# Patient Record
Sex: Female | Born: 1960 | Race: White | Hispanic: No | Marital: Married | Smoking: Never smoker
Health system: Southern US, Community
[De-identification: ages and names within clinical notes are randomized; demographics above are authoritative.]

## PROBLEM LIST (undated history)

## (undated) DIAGNOSIS — K648 Other hemorrhoids: Secondary | ICD-10-CM

## (undated) DIAGNOSIS — B379 Candidiasis, unspecified: Secondary | ICD-10-CM

## (undated) DIAGNOSIS — F53 Postpartum depression: Secondary | ICD-10-CM

## (undated) DIAGNOSIS — J302 Other seasonal allergic rhinitis: Secondary | ICD-10-CM

## (undated) DIAGNOSIS — M545 Low back pain, unspecified: Secondary | ICD-10-CM

## (undated) DIAGNOSIS — Z780 Asymptomatic menopausal state: Secondary | ICD-10-CM

## (undated) DIAGNOSIS — C439 Malignant melanoma of skin, unspecified: Secondary | ICD-10-CM

## (undated) DIAGNOSIS — O99345 Other mental disorders complicating the puerperium: Secondary | ICD-10-CM

## (undated) HISTORY — DX: Candidiasis, unspecified: B37.9

## (undated) HISTORY — PX: NOSE SURGERY: SHX723

## (undated) HISTORY — DX: Asymptomatic menopausal state: Z78.0

## (undated) HISTORY — DX: Other seasonal allergic rhinitis: J30.2

## (undated) HISTORY — DX: Malignant melanoma of skin, unspecified: C43.9

## (undated) HISTORY — DX: Low back pain: M54.5

## (undated) HISTORY — PX: WISDOM TOOTH EXTRACTION: SHX21

## (undated) HISTORY — DX: Low back pain, unspecified: M54.50

## (undated) HISTORY — PX: MELANOMA EXCISION: SHX5266

## (undated) HISTORY — DX: Postpartum depression: F53.0

## (undated) HISTORY — DX: Other mental disorders complicating the puerperium: O99.345

## (undated) HISTORY — DX: Other hemorrhoids: K64.8

---

## 1999-11-02 ENCOUNTER — Other Ambulatory Visit: Admission: RE | Admit: 1999-11-02 | Discharge: 1999-11-02 | Payer: Self-pay | Admitting: Obstetrics and Gynecology

## 2000-08-10 ENCOUNTER — Inpatient Hospital Stay (HOSPITAL_COMMUNITY): Admission: AD | Admit: 2000-08-10 | Discharge: 2000-08-12 | Payer: Self-pay | Admitting: Obstetrics and Gynecology

## 2000-08-14 ENCOUNTER — Encounter: Admission: RE | Admit: 2000-08-14 | Discharge: 2000-09-13 | Payer: Self-pay | Admitting: Obstetrics and Gynecology

## 2000-09-22 ENCOUNTER — Other Ambulatory Visit: Admission: RE | Admit: 2000-09-22 | Discharge: 2000-09-22 | Payer: Self-pay | Admitting: Obstetrics and Gynecology

## 2003-06-28 ENCOUNTER — Other Ambulatory Visit: Admission: RE | Admit: 2003-06-28 | Discharge: 2003-06-28 | Payer: Self-pay | Admitting: Obstetrics and Gynecology

## 2004-08-27 ENCOUNTER — Encounter: Admission: RE | Admit: 2004-08-27 | Discharge: 2004-08-27 | Payer: Self-pay | Admitting: Family Medicine

## 2007-11-19 ENCOUNTER — Other Ambulatory Visit: Admission: RE | Admit: 2007-11-19 | Discharge: 2007-11-19 | Payer: Self-pay | Admitting: Family Medicine

## 2008-11-23 ENCOUNTER — Encounter: Admission: RE | Admit: 2008-11-23 | Discharge: 2008-11-23 | Payer: Self-pay | Admitting: Family Medicine

## 2010-06-24 ENCOUNTER — Encounter: Payer: Self-pay | Admitting: Gastroenterology

## 2010-10-19 NOTE — H&P (Signed)
[facility]  Patient:    Traci Welch, Traci Welch                        MRN: [id number] Adm. Date:  [id number] Attending:  Silverio Lay A                         History and Physical  REASON FOR ADMISSION:         Intrauterine pregnancy at 39 weeks and 2 days with regular uterine contractions.  HISTORY OF PRESENT ILLNESS:   This is a 50 year old married white female, gravida 4, para 2, aborta 1, with a due date of August 15, 2000, who started experiencing regular uterine contractions every five minutes or less, starting at 7:30 this morning.  She is currently experiencing contractions every 2-3 minutes of an intensity of 8-10/10, lasting 60-90 seconds.  She has had a mild bloody show, no leaking of fluid, and reports good fetal activity and denies any symptoms of pregnancy-induced hypertension.  PRENATAL COURSE:              Blood type O positive, RPR nonreactive, rubella immune, HBsAg negative, HIV negative, gonorrhea negative, Chlamydia negative, Pap smear normal, 16 week amniocentesis showing a normal female karyotype, 20 week ultrasound revealing a normal anatomy cervi with a posterior placenta and normal cervical length.  A 28 week glucose tolerance test was within normal limits, and 25 week group B streptococcus was positive.  Prenatal course was otherwise uneventful.  PAST MEDICAL HISTORY:         No known drug allergies.  In 1998, spontaneous miscarriage at seven weeks.  No D&C.  No complication.  In April 1999, elective primary low transverse cesarean section for breech presentation of a female infant born at 40 weeks, weighing 7 pounds 14 ounces.  Baby deceased at 5-1/2 months of SIDS.  August 2000, spontaneous vaginal delivery at 38 weeks of a female infant, weighing 7 pounds 4 ounces, no complications.  History of postpartum depression with first pregnancy probably due to grief.  History of abnormal Pap smears with no treatment.  FAMILY HISTORY:                Mother with chronic hypertension.  Strong family history of depression.  SOCIAL HISTORY:               Married, nonsmoker, homemaker.  PHYSICAL EXAMINATION:  VITAL SIGNS:                  Normal.  HEENT:                        Negative.  LUNGS:                        Clear.  HEART:                        Normal.  ABDOMEN:                      Gravid, nontender.  Fundal height is at 38 cm two days ago.  VAGINAL:                      Exam by admitting nurse, 5-6 cm, 90% effaced, vertex 0 +1.  Fetal heart rate tracings are reactive.  EXTREMITIES:  Negative.  ASSESSMENT:                   Intrauterine pregnancy at 39 weeks and 2 days in active labor.  Status post primary low transverse cesarean section with one previous successful VBAC.  History of postpartum depression.  PLAN:                         The patient is admitted to L&D, is requesting epidural.  Will rupture membranes as soon as possible.  Spontaneous vaginal delivery expected. DD:  08/10/00 TD:  08/10/00 Job: [id number]

## 2011-11-28 ENCOUNTER — Other Ambulatory Visit: Payer: Self-pay | Admitting: Obstetrics and Gynecology

## 2011-11-28 DIAGNOSIS — Z1231 Encounter for screening mammogram for malignant neoplasm of breast: Secondary | ICD-10-CM

## 2012-01-01 ENCOUNTER — Ambulatory Visit
Admission: RE | Admit: 2012-01-01 | Discharge: 2012-01-01 | Disposition: A | Discharge status: Home / Self Care | Payer: 59 | Source: Ambulatory Visit | Attending: Obstetrics and Gynecology | Admitting: Obstetrics and Gynecology

## 2012-01-01 DIAGNOSIS — Z1231 Encounter for screening mammogram for malignant neoplasm of breast: Secondary | ICD-10-CM

## 2012-01-27 ENCOUNTER — Telehealth: Payer: Self-pay | Admitting: Obstetrics and Gynecology

## 2012-02-19 ENCOUNTER — Encounter: Payer: Self-pay | Admitting: Obstetrics and Gynecology

## 2012-02-19 ENCOUNTER — Ambulatory Visit (INDEPENDENT_AMBULATORY_CARE_PROVIDER_SITE_OTHER): Payer: 59 | Admitting: Obstetrics and Gynecology

## 2012-02-19 VITALS — BP 104/58 | Ht 68.0 in | Wt 146.0 lb

## 2012-02-19 DIAGNOSIS — N951 Menopausal and female climacteric states: Secondary | ICD-10-CM

## 2012-02-19 DIAGNOSIS — Z01419 Encounter for gynecological examination (general) (routine) without abnormal findings: Secondary | ICD-10-CM

## 2012-02-19 DIAGNOSIS — K649 Unspecified hemorrhoids: Secondary | ICD-10-CM

## 2012-02-19 LAB — FOLLICLE STIMULATING HORMONE: FSH: 112.6 m[IU]/mL

## 2012-02-19 MED ORDER — DOXEPIN HCL 3 MG PO TABS: 3.0000 mg | ORAL_TABLET | Freq: Every day | ORAL | Status: DC

## 2012-02-19 MED ORDER — NORETHINDRONE ACET-ETHINYL EST 1-20 MG-MCG PO TABS: 1.0000 | ORAL_TABLET | Freq: Every day | ORAL | Status: DC

## 2012-02-19 MED ORDER — SERTRALINE HCL 100 MG PO TABS: 100.0000 mg | ORAL_TABLET | Freq: Every day | ORAL | Status: DC

## 2012-02-19 NOTE — Progress Notes (Signed)
The patient reports:normal menses, no abnormal bleeding, pelvic pain or discharge  Contraception:vasectomy  Last mammogram: was normal August 2013 Last pap: was normal 2007  GC/Chlamydia cultures offered: declined HIV/RPR/HbsAg offered:  declined HSV 1 and 2 glycoprotein offered: declined  Menstrual cycle regular and monthly: No: perimenopausal  Menstrual flow normal: No: heavy   Urinary symptoms: none Normal bowel movements: Yes Reports abuse at home: No:   Subjective:    Traci Welch is a 51 y.o. female, [phone], who presents for an annual exam.     History   Social History  . Marital Status: Married    Spouse Name: N/A    Number of Children: N/A  . Years of Education: N/A   Social History Main Topics  . Smoking status: Never Smoker   . Smokeless tobacco: Never Used  . Alcohol Use: 4.2 oz/week    7 Glasses of wine per week  . Drug Use: No  . Sexually Active: Yes    Birth Control/ Protection: None, Surgical     vas   Other Topics Concern  . None   Social History Narrative  . None    Menstrual cycle:   LMP: Patient's last menstrual period was 01/03/2012.           Cycle: irregular every 28-60 days, 6 days, heavy flow for 6 days, 1 pad every 2 hours, clots ad 1 cm. No dysmenorrhea. Feels constant pressure on bladder like when pregnant. No PCB or dyspareunia. Hot flashes improved with black cohosh.Night sweats  The following portions of the patient's history were reviewed and updated as appropriate: allergies, current medications, past family history, past medical history, past social history, past surgical history and problem list.  Review of Systems Pertinent items are noted in HPI. Breast:Negative for breast lump,nipple discharge or nipple retraction Gastrointestinal: Negative for abdominal pain, change in bowel habits or rectal bleeding. Chronic constipation with severe hemorrhoids. Urinary:negative   Objective:    BP 104/58  Ht 5\' 8"  (1.727 m)  Wt 146  lb (66.225 kg)  BMI 22.20 kg/m2  LMP 01/03/2012    Weight:  Wt Readings from Last 1 Encounters:  02/19/12 146 lb (66.225 kg)          BMI: Body mass index is 22.20 kg/(m^2).  General Appearance: Alert, appropriate appearance for age. No acute distress HEENT: Grossly normal Neck / Thyroid: Supple, no masses, nodes or enlargement Lungs: clear to auscultation bilaterally Back: No CVA tenderness Breast Exam: No masses or nodes.No dimpling, nipple retraction or discharge. Cardiovascular: Regular rate and rhythm. S1, S2, no murmur Gastrointestinal: Soft, non-tender, no masses or organomegaly Pelvic Exam: Vulva and vagina appear normal. Bimanual exam reveals normal uterus and adnexa. Rectovaginal: normal rectal, no masses Lymphatic Exam: Non-palpable nodes in neck, clavicular, axillary, or inguinal regions Skin: no rash or abnormalities Neurologic: Normal gait and speech, no tremor  Psychiatric: Alert and oriented, appropriate affect.     Assessment:    Normal gyn exam  Peri-menopausal DUB   Plan:    mammogram pap smear return annually or prn Referral to [facility] for hemorrhoids and Traci Welch for [facility] screen STD screening: declined Contraception:vasectomy  Check FSH: will call pt with results. If non- menopausal will start Loestrrin 1/20 for cycle control ( non-smoker and no previous VTE) Traci Welch 3 mg HS for insomnia Discussed Align for constipation      Traci Welch JMD

## 2012-02-20 LAB — PAP IG W/ RFLX HPV ASCU

## 2012-02-20 NOTE — Progress Notes (Signed)
Quick Note:  Please call pt and inform that [facility] is menopausal. No need for Loestrin.   ______

## 2012-02-24 ENCOUNTER — Telehealth: Payer: Self-pay

## 2012-02-24 NOTE — Telephone Encounter (Signed)
LMTC @ 12:00  ld

## 2012-02-24 NOTE — Telephone Encounter (Signed)
Message copied by Larwance Rote on Mon Feb 24, 2012 11:59 AM ------      Message from: Silverio Lay      Created: Thu Feb 20, 2012 12:43 PM       Please call pt and inform that [facility] is menopausal. No need for Loestrin.

## 2012-02-25 ENCOUNTER — Telehealth: Payer: Self-pay | Admitting: Obstetrics and Gynecology

## 2012-02-25 ENCOUNTER — Ambulatory Visit (INDEPENDENT_AMBULATORY_CARE_PROVIDER_SITE_OTHER): Payer: Self-pay | Admitting: General Surgery

## 2012-02-25 NOTE — Telephone Encounter (Signed)
Pt notified of FSH indicating menopause.  No need for BCP.  Pt agreeable.  ld

## 2012-05-14 ENCOUNTER — Other Ambulatory Visit: Payer: Self-pay | Admitting: Obstetrics and Gynecology

## 2012-05-20 ENCOUNTER — Other Ambulatory Visit: Payer: Self-pay

## 2012-05-20 MED ORDER — SERTRALINE HCL 100 MG PO TABS: 100.0000 mg | ORAL_TABLET | Freq: Every day | ORAL | Status: DC

## 2012-07-18 ENCOUNTER — Other Ambulatory Visit: Payer: Self-pay

## 2013-04-08 ENCOUNTER — Other Ambulatory Visit: Payer: Self-pay

## 2013-12-08 ENCOUNTER — Encounter: Payer: Self-pay | Admitting: Gynecology

## 2013-12-08 ENCOUNTER — Ambulatory Visit (INDEPENDENT_AMBULATORY_CARE_PROVIDER_SITE_OTHER): Payer: BC Managed Care – PPO | Admitting: Gynecology

## 2013-12-08 VITALS — BP 90/64 | HR 72 | Resp 16 | Ht 68.0 in | Wt 153.0 lb

## 2013-12-08 DIAGNOSIS — Z0000 Encounter for general adult medical examination without abnormal findings: Secondary | ICD-10-CM

## 2013-12-08 DIAGNOSIS — N951 Menopausal and female climacteric states: Secondary | ICD-10-CM | POA: Insufficient documentation

## 2013-12-08 DIAGNOSIS — Z01419 Encounter for gynecological examination (general) (routine) without abnormal findings: Secondary | ICD-10-CM

## 2013-12-08 LAB — POCT URINALYSIS DIPSTICK
RBC UA: 2
Urobilinogen, UA: NEGATIVE
pH, UA: 5

## 2013-12-08 LAB — HEMOGLOBIN, FINGERSTICK: Hemoglobin, fingerstick: 13.9 g/dL (ref 12.0–16.0)

## 2013-12-08 MED ORDER — ESTRADIOL-LEVONORGESTREL 0.045-0.015 MG/DAY TD PTWK: 1.0000 | MEDICATED_PATCH | TRANSDERMAL | Status: DC

## 2013-12-08 NOTE — Progress Notes (Signed)
53 y.o. Married Caucasian female   [phone] here for annual exam. Pt reports menses are absent due to Menopause. She does report hot flashes, does have night sweats, does not have vaginal dryness.  She is not using lubricants.  She does not report post-menopasual bleeding. Pt reports hot flashes was using black cohash but no longer working.  Can have rapid sequence.  Night sweats are getting better. Is using unisom for long standing insomnia and disrupted sleep.  Pt fearful of cancer so has not tried estrogen.  Patient's last menstrual period was 09/01/2012.          Sexually active: Yes.    The current method of family planning is post menopausal status and Husband has vasectomy.    Exercising: Yes.    walk 4x/wk Last pap: 02/19/12 NEG Abnormal PAP: No Mammogram:  2013 Epic Bi-Rads 1 BSE: yes Colonoscopy: 04/2012 Normal  DEXA: never had one  Alcohol: 5 drinks/wk Tobacco: no  Hgb: 13.9 ; Urine: Blood 2, Leuks 2  Health Maintenance  Topic Date Due  . Tetanus/tdap  01/06/1981  . Mammogram  01/07/2012  . Influenza Vaccine  01/01/2014  . Pap Smear  02/19/2015  . Colonoscopy  04/22/2022    Family History  Problem Relation Age of Onset  . Emphysema Mother   . Hypertension Mother   . Migraines Mother   . Throat cancer Father     There are no active problems to display for this patient.   Past Medical History  Diagnosis Date  . Yeast infection     history   . Postpartum depression     Past Surgical History  Procedure Laterality Date  . Nose surgery    . Cesarean section    . Wisdom tooth extraction    . Melanoma excision Left     Leg    Allergies: Review of patient's allergies indicates no known allergies.  Current Outpatient Prescriptions  Medication Sig Dispense Refill  . calcium carbonate 200 MG capsule Take 250 mg by mouth 2 (two) times daily with a meal.      . doxylamine, Sleep, (UNISOM) 25 MG tablet Take 25 mg by mouth at bedtime as needed.      . sertraline  (ZOLOFT) 100 MG tablet Take 1 tablet (100 mg total) by mouth daily.  30 tablet  9   No current facility-administered medications for this visit.    ROS: Pertinent items are noted in HPI.  Exam:    BP 90/64  Pulse 72  Resp 16  Ht 5\' 8"  (1.727 m)  Wt 153 lb (69.4 kg)  BMI 23.27 kg/m2  LMP 09/01/2012 Weight change: @WEIGHTCHANGE @ Last 3 height recordings:  Ht Readings from Last 3 Encounters:  12/08/13 5\' 8"  (1.727 m)  02/19/12 5\' 8"  (1.727 m)   General appearance: alert, cooperative and appears stated age Head: Normocephalic, without obvious abnormality, atraumatic Neck: no adenopathy, no carotid bruit, no JVD, supple, symmetrical, trachea midline and thyroid not enlarged, symmetric, no tenderness/mass/nodules Lungs: clear to auscultation bilaterally Breasts: normal appearance, no masses or tenderness Heart: regular rate and rhythm, S1, S2 normal, no murmur, click, rub or gallop Abdomen: soft, non-tender; bowel sounds normal; no masses,  no organomegaly Extremities: extremities normal, atraumatic, no cyanosis or edema Skin: Skin color, texture, turgor normal. No rashes or lesions Lymph nodes: Cervical, supraclavicular, and axillary nodes normal. no inguinal nodes palpated Neurologic: Grossly normal   Pelvic: External genitalia:  no lesions  Urethra: normal appearing urethra with no masses, tenderness or lesions              Bartholins and Skenes: Bartholin's, Urethra, Skene's normal                 Vagina: normal appearing vagina with normal color and discharge, no lesions              Cervix: normal appearance              Pap taken: No.        Bimanual Exam:  Uterus:  uterus is normal size, shape, consistency and nontender                                      Adnexa:    normal adnexa in size, nontender and no masses                                      Rectovaginal: Confirms                                      Anus:  normal sphincter tone, no lesions         1. Routine gynecological examination  counseled on breast self exam, mammography screening, menopause, osteoporosis, adequate intake of calcium and vitamin D return annually or prn Discussed PAP guideline changes, importance of weight bearing exercises, calcium, vit D and balanced diet.  2. Laboratory examination ordered as part of a routine general medical examination  - Hemoglobin, fingerstick - POCT Urinalysis Dipstick  3. Menopausal symptom We reviewed the recommendations of ACOG and WHI re HRT, risks and benefits reviewed, affects on bone, colon, cholesterol, coagulation and menopausal symptoms discussed.  We also discussed non-hormonal options such as SSRI's, SSRI/SNRI's such as effexor and mechanism of action.  Oral and non-oral options reviewed as well.  Pt without contraindications. Questions addressed.    - estradiol-levonorgestrel (CLIMARA PRO) 0.045-0.015 MG/DAY; Place 1 patch onto the skin once a week.  Dispense: 4 patch; Refill: 12 Additional 75m spent counseling on HRT, >50% face to face   An After Visit Summary was printed and given to the patient.

## 2013-12-08 NOTE — Patient Instructions (Signed)

## 2013-12-09 ENCOUNTER — Other Ambulatory Visit: Payer: Self-pay | Admitting: Gynecology

## 2013-12-09 ENCOUNTER — Telehealth: Payer: Self-pay | Admitting: Gynecology

## 2013-12-09 NOTE — Telephone Encounter (Signed)
Patient calling for assistance with her RX Dr. Charlies Constable prescribed yesterday. It is not covered by her insurance so she faxed over a list of covered medications.

## 2013-12-09 NOTE — Telephone Encounter (Signed)
Combipatch 0.05/0.25mg  dosages is very close to what she is using.  This patch is twice weekly.  Can send RX to her pharmacy through next AEX.  Alteratives sheet signed and will be scanned.

## 2013-12-09 NOTE — Telephone Encounter (Signed)
Spoke with patient. Patient was prescribed Climara which will be $128 for a month supply. Patient was able to get a list of covered medications from her insurance and has sent the list over. Patient would like to switch to one of the covered medications at this time. Advised patient that Dr.Lathrop is out of the office today and that I could send a message over to the covering provider and provide them the list of covered medications and get back to patient. Patient works at a [employer] and would like a message left at [phone].   Dr.Miller, list of covered medications to your desk for review and advise. Thank you.  Cc:Dr.Lathrop

## 2013-12-10 ENCOUNTER — Other Ambulatory Visit: Payer: Self-pay

## 2013-12-10 MED ORDER — ESTRADIOL-NORETHINDRONE ACET 0.05-0.25 MG/DAY TD PTTW: 1.0000 | MEDICATED_PATCH | TRANSDERMAL | Status: DC

## 2013-12-10 NOTE — Telephone Encounter (Signed)
Pt called stating rx was not at pharmacy.  Sent in the rx to [facility]. Refilled until next AEX per Dr. Sabra Heck last encounter.  Encounter closed

## 2013-12-10 NOTE — Telephone Encounter (Signed)
There were many choices on the list but the exact cost wasn't there and all of them seem branded which I think would be expensive.  We could try estradiol 0.5mg  daily and Provera 2.5mg  daily.  It's oral but should be less expensive.

## 2013-12-10 NOTE — Telephone Encounter (Signed)
Patient's pharmacy called re: Combi Patch is over $100.00 with patient's insurance. The patient cannot afford this cost and the pharmacist is not aware of anything generic that is similar. Per the pharmacist, the patient is open to trying something in a pill form instead. Please advise.

## 2013-12-10 NOTE — Telephone Encounter (Signed)
Entered in error

## 2013-12-10 NOTE — Telephone Encounter (Signed)
Routing to Dr.Miller to review and advise in case there were any oral medications on the list provided.

## 2013-12-13 MED ORDER — MEDROXYPROGESTERONE ACETATE 2.5 MG PO TABS: 2.5000 mg | ORAL_TABLET | Freq: Every day | ORAL | Status: DC

## 2013-12-13 MED ORDER — ESTRADIOL 0.5 MG PO TABS: 0.5000 mg | ORAL_TABLET | Freq: Every day | ORAL | Status: DC

## 2013-12-13 NOTE — Telephone Encounter (Signed)
Orders placed and sent to [facility].

## 2013-12-14 ENCOUNTER — Telehealth: Payer: Self-pay | Admitting: Gynecology

## 2013-12-14 MED ORDER — CIPROFLOXACIN HCL 500 MG PO TABS: 500.0000 mg | ORAL_TABLET | Freq: Two times a day (BID) | ORAL | Status: DC

## 2013-12-14 NOTE — Telephone Encounter (Signed)
Call to patient,no answer.  Will advise new rx sent and to ensure she understands instructions for use.

## 2013-12-14 NOTE — Telephone Encounter (Signed)
Spoke with Dr. Charlies Constable, patient had urine testing at office visit and patient states she spoke with Dr. Charlies Constable about this.  Requests treatment and declines office visit.  Denies fevers, nausea, vomiting, flank pain, abdominal pain.  Advised discussed with Dr. Charlies Constable and will send Cipro 500 mg bid x 7 days and follow up with office visit if symptoms not improved. Patient is agreeable to this plan.  Routing to provider for final review. Patient agreeable to disposition. Will close encounter

## 2013-12-14 NOTE — Telephone Encounter (Signed)
Patient calling to speak with nurse about urinary tract infection symptoms "not getting better." The patient declined an appointment stating her last labs were abnormal and she hopes to start antibiotics.  Jewell

## 2014-01-28 ENCOUNTER — Encounter: Payer: Self-pay | Admitting: Obstetrics and Gynecology

## 2014-03-03 ENCOUNTER — Other Ambulatory Visit: Payer: Self-pay | Admitting: Gynecology

## 2014-03-03 NOTE — Telephone Encounter (Signed)
Pt is asking for a refill for   sertraline (ZOLOFT) 100 MG tablet  Take 1 tablet (100 mg total) by mouth daily., Starting 05/20/2012, Until Discontinued, Normal, Last Dose: Not Recorded  Refills: 9 ordered Pharmacy: [facility] - [place], [facility] [address]  Dr lathrop has never given pt this medication before but she switched docs this year and needs to get it from lathrop now.

## 2014-03-03 NOTE — Telephone Encounter (Signed)
Refill request from patient for SERTRALINE Last filled by MD on - never filled by our office Last AEX - 12/08/13   Pt is requesting refill from our office.  Please advise refills.

## 2014-03-04 MED ORDER — SERTRALINE HCL 100 MG PO TABS: 100.0000 mg | ORAL_TABLET | Freq: Every day | ORAL | Status: DC

## 2014-03-04 NOTE — Telephone Encounter (Signed)
Okay to refill per Dr. Charlies Constable to July of 2016

## 2014-03-11 ENCOUNTER — Other Ambulatory Visit: Payer: Self-pay | Admitting: Obstetrics & Gynecology

## 2014-03-11 NOTE — Telephone Encounter (Signed)
Last AEX: 12/08/13 Last refill: Both rx-12/13/13 #30 X 3 Current AEX:NS Last MMG: 01/01/12 Bi-Rads Neg  Please advise

## 2014-03-11 NOTE — Telephone Encounter (Signed)
PT NEEDS MAMMOGRAM BEFORE MORE REFILLS SENT IN-#90, NO REFILL TODAY

## 2014-03-17 NOTE — Telephone Encounter (Signed)
Spoke with patient. Recent refills of estradiol and progesterone have been sent. Patient denies complaints. States she is doing well with new rx and denies symptoms.  She will call back with any further questions or concerns. Reminded patient to ensure to take both estrogen and progesterone prescriptions as directed. She is agreeable.  Routing to provider for final review. Patient agreeable to disposition. Will close encounter

## 2014-04-04 ENCOUNTER — Encounter: Payer: Self-pay | Admitting: Gynecology

## 2014-04-12 ENCOUNTER — Telehealth: Payer: Self-pay

## 2014-04-12 DIAGNOSIS — N950 Postmenopausal bleeding: Secondary | ICD-10-CM

## 2014-04-12 NOTE — Telephone Encounter (Signed)
Pt states she is having some break through bleeding. She would like a call from a nurse.

## 2014-04-12 NOTE — Telephone Encounter (Signed)
Spoke with patient. Patient states that she began spotting on 10/30 until 11/2. Spotting began again on 11/8. Patient has been changing pad/tampon two times per day. Denies any abdominal discomfort or pain. Patient's LMP prior was on 09/01/12. Advised patient will need to be seen for evaluation with MD in our office. Advised since it has been over 12 months since her LMP provider may recommend she has and EMB. Advised this may or may not need to be done and will be recommended at time of evaluation. Advised this will allow [place] to see if there are any changes in the endometrial lining that may be causing bleeding. Patient is agreeable. Patient is requesting to come in next week to see provider. Advised will speak with providers regarding best appointment dates and times and return call to schedule. Patient is agreeable.

## 2014-04-12 NOTE — Telephone Encounter (Signed)
Spoke with patient. Advised patient spoke with Dr.Miller. Offered patient appointment on 11/12 for ultrasound and possible EMB with Dr.Miller. Patient declines due to schedule this week. Appointment scheduled for 11/19 at 3:30pm with Dr.Miller for evaluation and possible EMB (time okay per Gay Filler). Advised patient to take 800 mg of ibuprofen or motrin one hour before appointment. Patient is agreeable to date and time or appointment. Order placed for possible EMB. Will need precert.  Cc: Felipa Emory for precert  Routing to provider for final review. Patient agreeable to disposition. Will close encounter

## 2014-04-21 ENCOUNTER — Ambulatory Visit (INDEPENDENT_AMBULATORY_CARE_PROVIDER_SITE_OTHER): Payer: BC Managed Care – PPO | Admitting: Obstetrics & Gynecology

## 2014-04-21 VITALS — BP 104/62 | HR 72 | Resp 16 | Wt 155.6 lb

## 2014-04-21 DIAGNOSIS — N950 Postmenopausal bleeding: Secondary | ICD-10-CM

## 2014-04-21 DIAGNOSIS — R3989 Other symptoms and signs involving the genitourinary system: Secondary | ICD-10-CM

## 2014-04-21 NOTE — Progress Notes (Signed)
Subjective:     Patient ID: Traci Welch, female   DOB: 1960-12-29, 53 y.o.   MRN: [id number]  HPI 53 yo [id number] with 17 days of bleeding ovar the past month.  LMP 4/14.  Pt is on HRT.  Reports had bleeding 03/25/14-04/04/14, then again on 04/10/14-04/16/14.  First bleeding episode was much like a cycle and accompanied by acne.  Denies breast tenderness or bloating.  Second bleeding episode was lighter with some mild cramping.  Denies vaginal discharge.  No fever.  Pt also reports a 3 year hx of "bladder pain".  Reports this is not worse with intercourse.  Long hx of UTIs, although she hasn't been treated for one in several years.  Pain is just above public bone and low and achy in sensation.  Denies incontinence.  Denies back pain.  Occasionally feels distended down low.   HRT dosage is estradiol 0.5mg  daily and provera 2.22m daily.    Review of Systems  All other systems reviewed and are negative.      Objective:   Physical Exam  Constitutional: She is oriented to person, place, and time. She appears well-developed and well-nourished.  Abdominal: Soft. Bowel sounds are normal. There is tenderness (mild suprapubic).  Genitourinary: Vagina normal and uterus normal. There is no rash, tenderness or lesion on the right labia. There is no rash, tenderness or lesion on the left labia. Uterus is not enlarged, not fixed and not tender. Cervix exhibits no motion tenderness and no discharge. Right adnexum displays no mass and no tenderness. Left adnexum displays no mass and no tenderness.  Pt very tender and uncomfortable with palpation of bladder via vaginal exam.  Musculoskeletal: Normal range of motion.  Lymphadenopathy:       Right: No inguinal adenopathy present.       Left: No inguinal adenopathy present.  Neurological: She is alert and oriented to person, place, and time.  Skin: Skin is warm and dry.  Psychiatric: She has a normal mood and affect.       Assessment:     PMP bleeding in pt  on HRT Suprapubic pain/bladder pain     Plan:     Biopsy pending.  Will await results before making additional recommendations Consider referral to urology.  Pt not sure she wants to do this but will consider.  Feel IC is real possibility for pain. Urine culture pending

## 2014-04-22 LAB — URINALYSIS, MICROSCOPIC ONLY
CASTS: NONE SEEN
CRYSTALS: NONE SEEN

## 2014-04-23 LAB — URINE CULTURE
Colony Count: NO GROWTH
ORGANISM ID, BACTERIA: NO GROWTH

## 2014-04-24 ENCOUNTER — Encounter: Payer: Self-pay | Admitting: Obstetrics and Gynecology

## 2014-04-26 ENCOUNTER — Ambulatory Visit: Payer: BC Managed Care – PPO | Admitting: Certified Nurse Midwife

## 2014-05-02 ENCOUNTER — Telehealth: Payer: Self-pay | Admitting: Obstetrics & Gynecology

## 2014-05-02 DIAGNOSIS — N938 Other specified abnormal uterine and vaginal bleeding: Secondary | ICD-10-CM

## 2014-05-02 NOTE — Telephone Encounter (Signed)
PR: $25

## 2014-05-02 NOTE — Telephone Encounter (Signed)
Patient left a message on answering machine 04/30/14 @ 10:45am. Patient is ready to schedule her PUS appointment.

## 2014-05-02 NOTE — Telephone Encounter (Signed)
Spoke with patient. Patient would like to schedule SHGM at this time. Offered appointment on 12/3 at 2pm but patient declines due to work schedule. Appointment scheduled for [facility] on 12/3 at 3:30pm with 4pm consult with Dr.Miller. Patient is agreeable to date and time. Please see note from Homer below regarding need for [facility] and scheduling. Placed order. Will need precert.  Notes Recorded by Lyman Speller, MD on 04/27/2014 at 6:10 PM Spoke with pt regarding negative pathology. Inactive endometrium noted. Pt still bleeding. Feel she should proceed with SHGM. Will plan for next Thursday. Pt aware office will call on Monday to schedule. OK to schedule without precert as I feel this should be done.  Cc: Felipa Emory for precert  Routing to provider for final review. Patient agreeable to disposition. Will close encounter

## 2014-05-05 ENCOUNTER — Ambulatory Visit (INDEPENDENT_AMBULATORY_CARE_PROVIDER_SITE_OTHER): Payer: BC Managed Care – PPO | Admitting: Obstetrics & Gynecology

## 2014-05-05 ENCOUNTER — Other Ambulatory Visit: Payer: Self-pay | Admitting: Obstetrics & Gynecology

## 2014-05-05 ENCOUNTER — Ambulatory Visit (INDEPENDENT_AMBULATORY_CARE_PROVIDER_SITE_OTHER): Payer: BC Managed Care – PPO

## 2014-05-05 DIAGNOSIS — D251 Intramural leiomyoma of uterus: Secondary | ICD-10-CM

## 2014-05-05 DIAGNOSIS — N950 Postmenopausal bleeding: Secondary | ICD-10-CM

## 2014-05-05 DIAGNOSIS — N938 Other specified abnormal uterine and vaginal bleeding: Secondary | ICD-10-CM

## 2014-05-05 MED ORDER — SERTRALINE HCL 100 MG PO TABS: ORAL_TABLET | ORAL | Status: DC

## 2014-05-05 MED ORDER — ESTRADIOL 0.5 MG PO TABS: ORAL_TABLET | ORAL | Status: DC

## 2014-05-05 MED ORDER — MEDROXYPROGESTERONE ACETATE 5 MG PO TABS: 5.0000 mg | ORAL_TABLET | Freq: Every day | ORAL | Status: DC

## 2014-05-05 NOTE — Progress Notes (Signed)
53 y.o.Marriedfemale here for a pelvic ultrasound due to PMP bleeding.  H/O [facility] 9/13 with Dr. Cletis Media of 112.  Pt has two episodes of bleeding this fall 10/23-11/2 which was heavy and then 11/8-11/14 which was more like a light period.  Endometrial biopsy 04/21/14 showed inactive endometrium.  As this didn't explain the bleeding, SHGM was recommended.  Pt here for this today.    Patient's last menstrual period was 09/01/2012.  Sexually active:  yes  Contraception: vasectomy  Technique:  Both transabdominal and transvaginal ultrasound examinations of the pelvis were performed. Transabdominal technique was performed for global imaging of the pelvis including uterus, ovaries, adnexal regions, and pelvic cul-de-sac.  It was necessary to proceed with endovaginal exam following the abdominal ultrasound transabdominal exam to visualize the endometrium and adnexa.  Color and duplex Doppler ultrasound was utilized to evaluate blood flow to the ovaries.   FINDINGS: UTERUS: 8.0 x 4.9 x 4.3cm with small 26mm intrmural fibroid EMS: 3.14mm ADNEXA:   Left ovary 1.6 x 1.0 x 0.7cm   Right ovary 2.3 x 1.4 x 1.0cm CUL DE SAC: no free fluid  SHSG:  After obtaining appropriate verbal consent from patient, the cervix was visualized using a speculum, and prepped with betadine.  A tenaculum  was applied to the cervix.  Dilation of the cervix was not necessary. The catheter was passed into the uterus and sterile saline introduced, with the following findings: no filling defects noted.  All instruments removed.  Pt tolerated procedure well.  Images reviewed with pt.  No findings noted on examination, ultrasound, or endometrial biopsy.  Pt is on HRT so will increase progesterone dosage.  Pt happy with response to HRT and doesn't want to go off.  Needs generic medications if at all possible.  Will increase provera to 5mg  daily.  Rx to pharmacy for both this and Estradiol.  Pt will call with any new bleeding  episodes.  Assessment:  PMP bleeding in pt on HRT with negative ultrasound except for small intramural fibroid  Plan: Continue estradiol 0.5mg  daily and increase Provera to 5mg  daily.  Pt will call with any new bleeding or new problems/issues.  Rx for Sertraline clarified.  Pt takes 1 1/2 to 2 tabs (150 - 200mg  daily) depending on season and other persona issues.  New rx to pharmacy.  ~25 minutes spent with patient >50% of time was in face to face discussion of above.

## 2014-05-13 ENCOUNTER — Encounter: Payer: Self-pay | Admitting: Obstetrics & Gynecology

## 2014-07-01 ENCOUNTER — Telehealth: Payer: Self-pay | Admitting: Obstetrics & Gynecology

## 2014-07-01 MED ORDER — MEDROXYPROGESTERONE ACETATE 5 MG PO TABS: 5.0000 mg | ORAL_TABLET | Freq: Every day | ORAL | Status: DC

## 2014-07-01 NOTE — Telephone Encounter (Signed)
Patient calling to speak with the nurse about her medroxyprogesterone.

## 2014-07-01 NOTE — Telephone Encounter (Signed)
Spoke with patient. Patient states "I think I have been taking the wrong dose of my medroxyprogesterone. I have been taking two pills a day and when I went to pick up my prescription they told me it was too soon to pick up. I think the dose changed and I was only supposed to be taking one per day." Advised patient per OV note from 05/05/2014 with Dr.Miller she was to increase to Provera 5mg  per day. Patient only needs to take one 5mg  tablet per day. Patient is agreeable. Requesting prescription be resent to pharmacy as she is unable to pick up at this time due to taking two per day. Resent rx for Provera 5 mg take 1 tablet per day to pharmacy on file. Patient is agreeable.  Routing to provider for final review. Patient agreeable to disposition. Will close encounter

## 2014-08-25 ENCOUNTER — Other Ambulatory Visit: Payer: Self-pay | Admitting: Physician Assistant

## 2014-09-06 ENCOUNTER — Other Ambulatory Visit: Payer: Self-pay

## 2014-09-06 DIAGNOSIS — Z1231 Encounter for screening mammogram for malignant neoplasm of breast: Secondary | ICD-10-CM

## 2014-09-12 ENCOUNTER — Ambulatory Visit
Admission: RE | Admit: 2014-09-12 | Discharge: 2014-09-12 | Disposition: A | Discharge status: Home / Self Care | Payer: BLUE CROSS/BLUE SHIELD | Source: Ambulatory Visit

## 2014-09-12 DIAGNOSIS — Z1231 Encounter for screening mammogram for malignant neoplasm of breast: Secondary | ICD-10-CM

## 2014-11-30 ENCOUNTER — Encounter: Payer: Self-pay | Admitting: Family Medicine

## 2014-11-30 ENCOUNTER — Ambulatory Visit (INDEPENDENT_AMBULATORY_CARE_PROVIDER_SITE_OTHER): Payer: BLUE CROSS/BLUE SHIELD | Admitting: Family Medicine

## 2014-11-30 VITALS — BP 100/62 | HR 64 | Ht 68.0 in | Wt 156.4 lb

## 2014-11-30 DIAGNOSIS — K219 Gastro-esophageal reflux disease without esophagitis: Secondary | ICD-10-CM | POA: Diagnosis not present

## 2014-11-30 NOTE — Progress Notes (Signed)
Chief Complaint  Patient presents with  . Gastrophageal Reflux    has had for about a year, worsened over the last month or so. Father has throat cancer and she is concerned that acid reflux could lead to throat cancer.    Patient presents with complaints of reflux.  Symptoms are more during the day than at night. Symptoms are daily for about a year.  She describes her symptoms as feeling reflux into her chest, throat, sometimes sore throat.  She has occasional cough, but also has some slight allergies.  No dysphagia. She has been taking zantac once daily, thinks dose is 75mg , or will use Tagamet.  Has never used PPI's. Her husband has reflux and was told of risks of daily PPI. She drinks 1 cup/coffee each morning. Denies any particular dietary changes (other than alcohol related to wine tasting over the past weekend). Her father has had esophageal cancer, battling for the last 2 years, doing well with treatments.  He was never a smoker, but had longstanding reflux.  She is a former patient.  No prior records are available.  This was an acute visit for reflux.  On her paperwork, she mentioned other concerns, but these were not able to be fully addressed today.  She has a long-standing history of depression, and she isn't sure that her meds are working anymore.  She tried increasing the dose of zoloft for at least a month--just felt more irritable when on higher doses of zoloft, didn't really seem to be more effective. She has been taking zoloft for 10 years.  She also mentioned daily low back pain/ache on her paperwork and at the very end of the visit.  Past Medical History  Diagnosis Date  . Yeast infection     history   . Postpartum depression   . Seasonal allergies   . Low back pain     chronic "back aches"  . Postmenopausal   . Melanoma end of 2014    left leg   Past Surgical History  Procedure Laterality Date  . Nose surgery    . Cesarean section    . Wisdom tooth extraction    .  Melanoma excision Left     Leg   History   Social History  . Marital Status: Married    Spouse Name: N/A  . Number of Children: N/A  . Years of Education: N/A   Occupational History  . Not on file.   Social History Main Topics  . Smoking status: Never Smoker   . Smokeless tobacco: Never Used  . Alcohol Use: 3.0 oz/week    5 Glasses of wine per week     Comment: 5 glasses of wine per week (1 glass of red wine before bed)  . Drug Use: No  . Sexual Activity:    Partners: Male    Birth Control/ Protection: Surgical     Comment: Husband has Vasectomy   Other Topics Concern  . Not on file   Social History Narrative   Married, 2 children, works for [employer]. 1 dog, 1 cat   Family History  Problem Relation Age of Onset  . Emphysema Mother   . Hypertension Mother   . Migraines Mother   . Throat cancer Father     nonsmoker, h/o reflux  . Testicular cancer Father   . Colitis Sister   . Diabetes Neg Hx   . Depression Sister    Outpatient Encounter Prescriptions as of 11/30/2014  Medication Sig  Note  . calcium carbonate (OS-CAL) 600 MG TABS tablet Take 600 mg by mouth daily with breakfast.   . doxylamine, Sleep, (UNISOM) 25 MG tablet Take 25 mg by mouth at bedtime as needed.   Marland Kitchen estradiol (ESTRACE) 0.5 MG tablet TAKE 1 TABLET (0.5 MG TOTAL) BY MOUTH DAILY.   . medroxyPROGESTERone (PROVERA) 5 MG tablet Take 1 tablet (5 mg total) by mouth daily.   . sertraline (ZOLOFT) 100 MG tablet 1 1/2 to 2 tablets daily 11/30/2014: Take 1-1.5 tablets each day  . [DISCONTINUED] calcium carbonate 200 MG capsule Take 250 mg by mouth 2 (two) times daily with a meal.    No facility-administered encounter medications on file as of 11/30/2014.  taking OTC zantac or tagamet daily as per HPI  Allergies  Allergen Reactions  . Phenergan [Promethazine Hcl] Other (See Comments)    Loss consciousness   ROS:  No headaches, dizziness, chest pain, shortness of breath, fever, chills.  No URI  symptoms.  +mild allergies with slight cough.  Occasional sore throat related to reflux.  No nausea, vomiting, abdominal pain, bowel changes.  No bleeding, bruising, rash.  +LBP and depression. No urinary complaints. See HPI.   PHYSICAL EXAM: BP 100/62 mmHg  Pulse 64  Ht 5\' 8"  (1.727 m)  Wt 156 lb 6.4 oz (70.943 kg)  BMI 23.79 kg/m2  LMP 09/01/2012 Well developed, pleasant female in no distress HEENT: PERRL, EOMI, conjunctiva clear. OP shows cobblestoning posteriorly, mild, otherwise normal. Neck: no lymphadenopathy, thyromegaly or mass Heart: regular rate and rhythm without murmur Lungs: clear bilaterally Abdomen: soft, normal bowel sounds. Nontender. No organomegaly or mass Extremities: no edema, 2+ pulse Psych: normal mood, affect, hygiene and grooming Skin: no rashes Neuro: alert and oriented.  Cranial nerves intact. Normal gait, strength.  ASSESSMENT/PLAN:  Gastroesophageal reflux disease without esophagitis - counseled re: diet/behavioral changes, risks of PPI's.  PPI OTC x 1-2 mos, change to rx if ineffective.   Take a once daily over the counter proton pump inhibitor such as Nexium, Prilosec or Prevacid.  Since your symptoms are mostly during the day, take this prior to breakfast. If this doesn't adequately control your symptoms after using it for up to 2 weeks, then call me for a prescription strength medication (and let me know which one you tried and failed).    Please try and modify the diet as per the handout.  After 1-2 months of taking a medication to control your symptoms, without any recurrent symptoms, you can consider trying to cut back (either back to the lower dose, or taking it less often--can be every other day at first, and some people eventually can use it just when the provoking foods are eaten).    If you have persistent symptoms, despite daily prescription medication, and/or if you have reflux symptoms lasting 5 years, these are indications to see the  gastroenterologist for endoscopy.  Another indication would be if you develop trouble swallowing.  Return in 1-2 weeks to f/u on gerd, and also to address her other concerns (back pain and depression.)

## 2014-11-30 NOTE — Patient Instructions (Addendum)
Gastroesophageal Reflux Disease, Adult Gastroesophageal reflux disease (GERD) happens when acid from your stomach flows up into the esophagus. When acid comes in contact with the esophagus, the acid causes soreness (inflammation) in the esophagus. Over time, GERD may create small holes (ulcers) in the lining of the esophagus. CAUSES   Increased body weight. This puts pressure on the stomach, making acid rise from the stomach into the esophagus.  Smoking. This increases acid production in the stomach.  Drinking alcohol. This causes decreased pressure in the lower esophageal sphincter (valve or ring of muscle between the esophagus and stomach), allowing acid from the stomach into the esophagus.  Late evening meals and a full stomach. This increases pressure and acid production in the stomach.  A malformed lower esophageal sphincter. Sometimes, no cause is found. SYMPTOMS   Burning pain in the lower part of the mid-chest behind the breastbone and in the mid-stomach area. This may occur twice a week or more often.  Trouble swallowing.  Sore throat.  Dry cough.  Asthma-like symptoms including chest tightness, shortness of breath, or wheezing. DIAGNOSIS  Your caregiver may be able to diagnose GERD based on your symptoms. In some cases, X-rays and other tests may be done to check for complications or to check the condition of your stomach and esophagus. TREATMENT  Your caregiver may recommend over-the-counter or prescription medicines to help decrease acid production. Ask your caregiver before starting or adding any new medicines.  HOME CARE INSTRUCTIONS   Change the factors that you can control. Ask your caregiver for guidance concerning weight loss, quitting smoking, and alcohol consumption.  Avoid foods and drinks that make your symptoms worse, such as:  Caffeine or alcoholic drinks.  Chocolate.  Peppermint or mint flavorings.  Garlic and onions.  Spicy foods.  Citrus fruits,  such as oranges, lemons, or limes.  Tomato-based foods such as sauce, chili, salsa, and pizza.  Fried and fatty foods.  Avoid lying down for the 3 hours prior to your bedtime or prior to taking a nap.  Eat small, frequent meals instead of large meals.  Wear loose-fitting clothing. Do not wear anything tight around your waist that causes pressure on your stomach.  Raise the head of your bed 6 to 8 inches with wood blocks to help you sleep. Extra pillows will not help.  Only take over-the-counter or prescription medicines for pain, discomfort, or fever as directed by your caregiver.  Do not take aspirin, ibuprofen, or other nonsteroidal anti-inflammatory drugs (NSAIDs). SEEK IMMEDIATE MEDICAL CARE IF:   You have pain in your arms, neck, jaw, teeth, or back.  Your pain increases or changes in intensity or duration.  You develop nausea, vomiting, or sweating (diaphoresis).  You develop shortness of breath, or you faint.  Your vomit is green, yellow, black, or looks like coffee grounds or blood.  Your stool is red, bloody, or black. These symptoms could be signs of other problems, such as heart disease, gastric bleeding, or esophageal bleeding. MAKE SURE YOU:   Understand these instructions.  Will watch your condition.  Will get help right away if you are not doing well or get worse. Document Released: 02/27/2005 Document Revised: 08/12/2011 Document Reviewed: 12/07/2010 [facility] Patient Information 2015 [place], [place]. This information is not intended to replace advice given to you by your health care provider. Make sure you discuss any questions you have with your health care provider.   Take a once daily over the counter proton pump inhibitor such as Nexium, Prilosec  or Prevacid.  Since your symptoms are mostly during the day, take this prior to breakfast. If this doesn't adequately control your symptoms after using it for up to 2 weeks, then call me for a prescription  strength medication (and let me know which one you tried and failed).    Please try and modify the diet as per the handout.  After 1-2 months of taking a medication to control your symptoms, without any recurrent symptoms, you can consider trying to cut back (either back to the lower dose, or taking it less often--can be every other day at first, and some people eventually can use it just when the provoking foods are eaten).  .  If you have persistent symptoms, despite daily prescription medication, and/or if you have reflux symptoms lasting 5 years, these are indications to see the gastroenterologist for endoscopy.  Another indication would be if you develop trouble swallowing.

## 2014-12-01 ENCOUNTER — Encounter: Payer: Self-pay | Admitting: Family Medicine

## 2014-12-07 ENCOUNTER — Ambulatory Visit (INDEPENDENT_AMBULATORY_CARE_PROVIDER_SITE_OTHER): Payer: BLUE CROSS/BLUE SHIELD | Admitting: Family Medicine

## 2014-12-07 ENCOUNTER — Encounter: Payer: Self-pay | Admitting: Family Medicine

## 2014-12-07 VITALS — BP 118/76 | HR 64 | Wt 157.8 lb

## 2014-12-07 DIAGNOSIS — F909 Attention-deficit hyperactivity disorder, unspecified type: Secondary | ICD-10-CM

## 2014-12-07 DIAGNOSIS — M545 Low back pain, unspecified: Secondary | ICD-10-CM | POA: Insufficient documentation

## 2014-12-07 DIAGNOSIS — F322 Major depressive disorder, single episode, severe without psychotic features: Secondary | ICD-10-CM | POA: Diagnosis not present

## 2014-12-07 DIAGNOSIS — K219 Gastro-esophageal reflux disease without esophagitis: Secondary | ICD-10-CM | POA: Insufficient documentation

## 2014-12-07 DIAGNOSIS — F329 Major depressive disorder, single episode, unspecified: Secondary | ICD-10-CM | POA: Insufficient documentation

## 2014-12-07 DIAGNOSIS — F988 Other specified behavioral and emotional disorders with onset usually occurring in childhood and adolescence: Secondary | ICD-10-CM | POA: Insufficient documentation

## 2014-12-07 MED ORDER — BUPROPION HCL ER (XL) 150 MG PO TB24
150.0000 mg | ORAL_TABLET | Freq: Every day | ORAL | Status: DC
Start: 2014-12-07 — End: 2015-01-19

## 2014-12-07 MED ORDER — NAPROXEN 500 MG PO TABS: 500.0000 mg | ORAL_TABLET | Freq: Two times a day (BID) | ORAL | Status: DC

## 2014-12-07 MED ORDER — CYCLOBENZAPRINE HCL 10 MG PO TABS: 10.0000 mg | ORAL_TABLET | Freq: Three times a day (TID) | ORAL | Status: DC | PRN

## 2014-12-07 NOTE — Patient Instructions (Addendum)
Depression:  Continue the 150mg  of zoloft daily. Add the Wellbutrin, taking it every morning.  Back pain--try using Flexeril at bedtime.  If you feel too sedated in the morning (groggy) then switch to taking just 1/2 tablet.  You may use this every night, if needed, or eventually if pain is getting less, just use it as needed for back pain.  Use the naproxen twice daily with food--try it for a week just to see if you can get the pain level reduced, and then you may not need to use it regularly like this, but just a week or so here and there when the pain flares.  Cut back on the dose (ie cut it in half, or switch to an OTC Aleve) if it ibothers your stomach.   Continue to do yoga and back exercises. Wear supportive shoes; use lumbar support. Consider topical medications such as biofreeze or icy hot. Heating pad is recommended--consider thermacare to use while at work or for long car rides.  Take your prescribed anti-inflammatory medication with food; discontinue or cut back the dose if you develop stomach pain/discomfort/side effects.  Do not take other over-the-counter pain medications such as ibuprofen, advil, motrin, aleve, naproxen at the same time.  Do not use longer than recommended.  It is okay to use acetaminophen (tylenol) along with this medication.    Let us know if you would like a referral to Physical Therapy.  This would be the next step if these other measures are ineffective.  Back Exercises These exercises may help you when beginning to rehabilitate your injury. Your symptoms may resolve with or without further involvement from your physician, physical therapist or athletic trainer. While completing these exercises, remember:   Restoring tissue flexibility helps normal motion to return to the joints. This allows healthier, less painful movement and activity.  An effective stretch should be held for at least 30 seconds.  A stretch should never be painful. You should only feel  a gentle lengthening or release in the stretched tissue. STRETCH - Extension, Prone on Elbows   Lie on your stomach on the floor, a bed will be too soft. Place your palms about shoulder width apart and at the height of your head.  Place your elbows under your shoulders. If this is too painful, stack pillows under your chest.  Allow your body to relax so that your hips drop lower and make contact more completely with the floor.  Hold this position for __________ seconds.  Slowly return to lying flat on the floor. Repeat __________ times. Complete this exercise __________ times per day.  RANGE OF MOTION - Extension, Prone Press Ups   Lie on your stomach on the floor, a bed will be too soft. Place your palms about shoulder width apart and at the height of your head.  Keeping your back as relaxed as possible, slowly straighten your elbows while keeping your hips on the floor. You may adjust the placement of your hands to maximize your comfort. As you gain motion, your hands will come more underneath your shoulders.  Hold this position __________ seconds.  Slowly return to lying flat on the floor. Repeat __________ times. Complete this exercise __________ times per day.  RANGE OF MOTION- Quadruped, Neutral Spine   Assume a hands and knees position on a firm surface. Keep your hands under your shoulders and your knees under your hips. You may place padding under your knees for comfort.  Drop your head and point your tail  bone toward the ground below you. This will round out your low back like an angry cat. Hold this position for __________ seconds.  Slowly lift your head and release your tail bone so that your back sags into a large arch, like an old horse.  Hold this position for __________ seconds.  Repeat this until you feel limber in your low back.  Now, find your "sweet spot." This will be the most comfortable position somewhere between the two previous positions. This is your  neutral spine. Once you have found this position, tense your stomach muscles to support your low back.  Hold this position for __________ seconds. Repeat __________ times. Complete this exercise __________ times per day.  STRETCH - Flexion, Single Knee to Chest   Lie on a firm bed or floor with both legs extended in front of you.  Keeping one leg in contact with the floor, bring your opposite knee to your chest. Hold your leg in place by either grabbing behind your thigh or at your knee.  Pull until you feel a gentle stretch in your low back. Hold __________ seconds.  Slowly release your grasp and repeat the exercise with the opposite side. Repeat __________ times. Complete this exercise __________ times per day.  STRETCH - Hamstrings, Standing  Stand or sit and extend your right / left leg, placing your foot on a chair or foot stool  Keeping a slight arch in your low back and your hips straight forward.  Lead with your chest and lean forward at the waist until you feel a gentle stretch in the back of your right / left knee or thigh. (When done correctly, this exercise requires leaning only a small distance.)  Hold this position for __________ seconds. Repeat __________ times. Complete this stretch __________ times per day. STRENGTHENING - Deep Abdominals, Pelvic Tilt   Lie on a firm bed or floor. Keeping your legs in front of you, bend your knees so they are both pointed toward the ceiling and your feet are flat on the floor.  Tense your lower abdominal muscles to press your low back into the floor. This motion will rotate your pelvis so that your tail bone is scooping upwards rather than pointing at your feet or into the floor.  With a gentle tension and even breathing, hold this position for __________ seconds. Repeat __________ times. Complete this exercise __________ times per day.  STRENGTHENING - Abdominals, Crunches   Lie on a firm bed or floor. Keeping your legs in front of  you, bend your knees so they are both pointed toward the ceiling and your feet are flat on the floor. Cross your arms over your chest.  Slightly tip your chin down without bending your neck.  Tense your abdominals and slowly lift your trunk high enough to just clear your shoulder blades. Lifting higher can put excessive stress on the low back and does not further strengthen your abdominal muscles.  Control your return to the starting position. Repeat __________ times. Complete this exercise __________ times per day.  STRENGTHENING - Quadruped, Opposite UE/LE Lift   Assume a hands and knees position on a firm surface. Keep your hands under your shoulders and your knees under your hips. You may place padding under your knees for comfort.  Find your neutral spine and gently tense your abdominal muscles so that you can maintain this position. Your shoulders and hips should form a rectangle that is parallel with the floor and is not twisted.  Keeping your trunk steady, lift your right hand no higher than your shoulder and then your left leg no higher than your hip. Make sure you are not holding your breath. Hold this position __________ seconds.  Continuing to keep your abdominal muscles tense and your back steady, slowly return to your starting position. Repeat with the opposite arm and leg. Repeat __________ times. Complete this exercise __________ times per day. Document Released: 06/07/2005 Document Revised: 08/12/2011 Document Reviewed: 09/01/2008 [facility] Patient Information 2015 [place], [place]. This information is not intended to replace advice given to you by your health care provider. Make sure you discuss any questions you have with your health care provider.  Hold 15-20 seconds; repeat 5-10 times, do 2x daily

## 2014-12-07 NOTE — Progress Notes (Signed)
Chief Complaint  Patient presents with  . multiple issues    mulitple issues, GERD, Low back pain and moods   GERD: Taking Prevacid OTC once daily in the morning, and symptoms have completely resolved. No further heartburn/reflux. No dysphagia.  Depression:  She originally was started on Paxil, changed to Zoloft after about 3 years, and has been on zoloft for 12-14 years.  Seems to be less effective over the last year.  She tried increasing the dose for a month up to 200mg , and didn't notice any improvement in symptoms, but felt more irritable. She doesn't feel her depression is as well controlled as in the past, on same medication. She has trouble falling asleep, takes her a while, but stays asleep all night. She takes Unisom nightly which helps.  Back pain: She reports having pain across her entire lower back off and on for "her whole back". She was diagnosed with a minor case of scoliosis, and thinks that is a factor.  She does some back exercises and yoga, but nothing helps all that much.  Can't get comfortable to sleep at night.   She got a new mattress 7 years ago, it is now starting to sag. No radiation of pain, numbness/tingling/bowel/bladder problems or any weakness. Uses lumbar pillow at work, has a sedentary job. Worse as the day goes on, 2/10 in the morning, up to 8/10 at night. She has used NSAIDs sporadically, and has used hydrocodone at night in the past, which was effective.  She also reports wanting to talk about getting back on Ritalin for ADD.  She had been on it years ago.  She hasn't needed it in a long time, but now is distracted, has trouble focusing.  Denies hyperactivity. Recalls having some side effects--feeling a little jittery, affected her sleep some, so took just the short-acting one once in the morning, and that seemed to work well enough.   She last took Ritalin 12 years ago when she lived in [place]. She didn't need it when she took time off work to raise her children,  or when part-time, but now she is working full time, challenging, detailed work.  PMH, PSH, SH reviewed.  Current Outpatient Prescriptions on File Prior to Visit  Medication Sig Dispense Refill  . calcium carbonate (OS-CAL) 600 MG TABS tablet Take 600 mg by mouth daily with breakfast.    . doxylamine, Sleep, (UNISOM) 25 MG tablet Take 25 mg by mouth at bedtime as needed.    Marland Kitchen estradiol (ESTRACE) 0.5 MG tablet TAKE 1 TABLET (0.5 MG TOTAL) BY MOUTH DAILY. 90 tablet 3  . medroxyPROGESTERone (PROVERA) 5 MG tablet Take 1 tablet (5 mg total) by mouth daily. 90 tablet 3  . sertraline (ZOLOFT) 100 MG tablet 1 1/2 to 2 tablets daily 180 tablet 3   No current facility-administered medications on file prior to visit.   Prevacid 15mg  daily  Allergies  Allergen Reactions  . Phenergan [Promethazine Hcl] Other (See Comments)    Loss consciousness    ROS:  No weight changes, headaches, dizziness, numbness, tingling, weakness, bowel/bladder dysfunction.  No urinary complaints. No bleeding, bruising, rash. No nausea, vomiting, heartburn, dysphagia or bowel changes.  +depression, irritability, trouble concentrating, trouble falling asleep (R/B Unisom nightly).  See HPI  PHYSICAL EXAM: BP 118/76 mmHg  Pulse 64  Wt 157 lb 12.8 oz (71.578 kg)  LMP 09/01/2012 Well developed, well-appearing, pleasant female in no distress Back: no spinal tenderness, CVA or SI joint tenderness.  No muscle spasm--no  tenderness on exam. Neuro: DTR's 2+ and symmetric. Normal strength, sensation, gait. Negative straight leg raise Psych: normal mood, affect, hygiene and grooming. Normal speech, eye contact Abdomen: soft, nontender, no mass  ASSESSMENT/PLAN:  Bilateral low back pain without sciatica - no radicular sx. trial of NSAIDs, muscle relaxant, core strengthening.  Refer to PT if not improving. discussed posture, heat, massage - Plan: cyclobenzaprine (FLEXERIL) 10 MG tablet, naproxen (NAPROSYN) 500 MG tablet  Major  depression, chronic - suboptimally controlled on Zoloft. Add Wellbutrin. risks/side effects reviewed. Also discussed Cymbalta (if wellbutrin not tolerated or ineffective) - Plan: buPROPion (WELLBUTRIN XL) 150 MG 24 hr tablet  Gastroesophageal reflux disease without esophagitis - improved on Prevacid (OTC).  Continue, then taper, if able. If needs to take daily, add MVI with minerals  ADD (attention deficit disorder) - past history, per pt. Consider restarting meds if concentration/attention not improved after adding wellbutrin   Add wellbutrin XL 150mg . Continue zoloft 150mg . F/u 4-6 weeks.  If not tolerating wellbutrin, consider change to Cymbalta If attention not improving with adding wellbutrin, restart ritalin (? Dose, and likely start short-acting since she did well on that before, change to long-acting if having trouble in the afternoons).  Depression:  Continue the 150mg  of zoloft daily. Add the Wellbutrin, taking it every morning.  Back pain--try using Flexeril at bedtime.  If you feel too sedated in the morning (groggy) then switch to taking just 1/2 tablet.  You may use this every night, if needed, or eventually if pain is getting less, just use it as needed for back pain.  Use the naproxen twice daily with food--try it for a week just to see if you can get the pain level reduced, and then you may not need to use it regularly like this, but just a week or so here and there when the pain flares.  Cut back on the dose (ie cut it in half, or switch to an OTC Aleve) if it bothers your stomach.   Continue to do yoga and back exercises. Wear supportive shoes; use lumbar support. Consider topical medications such as biofreeze or icy hot. Heating pad is recommended--consider thermacare to use while at work or for long car rides.  Let us know if you would like a referral to Physical Therapy.  This would be the next step if these other measures are ineffective.   Risks/side effects of all meds  reviewed in detail. 30 min visit, more than 1/2 spent counseling F/u 4-6 weeks

## 2014-12-15 ENCOUNTER — Ambulatory Visit: Payer: BC Managed Care – PPO | Admitting: Obstetrics and Gynecology

## 2014-12-16 ENCOUNTER — Ambulatory Visit: Payer: BC Managed Care – PPO | Admitting: Obstetrics and Gynecology

## 2015-01-19 ENCOUNTER — Ambulatory Visit (INDEPENDENT_AMBULATORY_CARE_PROVIDER_SITE_OTHER): Payer: BLUE CROSS/BLUE SHIELD | Admitting: Family Medicine

## 2015-01-19 ENCOUNTER — Encounter: Payer: Self-pay | Admitting: Family Medicine

## 2015-01-19 VITALS — BP 130/76 | HR 68 | Ht 68.0 in | Wt 161.2 lb

## 2015-01-19 DIAGNOSIS — Z23 Encounter for immunization: Secondary | ICD-10-CM

## 2015-01-19 DIAGNOSIS — F322 Major depressive disorder, single episode, severe without psychotic features: Secondary | ICD-10-CM

## 2015-01-19 DIAGNOSIS — K219 Gastro-esophageal reflux disease without esophagitis: Secondary | ICD-10-CM | POA: Diagnosis not present

## 2015-01-19 DIAGNOSIS — F900 Attention-deficit hyperactivity disorder, predominantly inattentive type: Secondary | ICD-10-CM

## 2015-01-19 DIAGNOSIS — F988 Other specified behavioral and emotional disorders with onset usually occurring in childhood and adolescence: Secondary | ICD-10-CM

## 2015-01-19 DIAGNOSIS — F329 Major depressive disorder, single episode, unspecified: Secondary | ICD-10-CM

## 2015-01-19 MED ORDER — PANTOPRAZOLE SODIUM 40 MG PO TBEC: 40.0000 mg | DELAYED_RELEASE_TABLET | Freq: Every day | ORAL | Status: DC

## 2015-01-19 MED ORDER — BUPROPION HCL ER (XL) 150 MG PO TB24: 150.0000 mg | ORAL_TABLET | Freq: Every day | ORAL | Status: DC

## 2015-01-19 MED ORDER — METHYLPHENIDATE HCL 10 MG PO TABS: ORAL_TABLET | ORAL | Status: DC

## 2015-01-19 NOTE — Patient Instructions (Signed)
Stop the Prevacid and all other over-the-counter acid medications.  Use the prescription strength pantoprazole instead, taken once daily before breakfast.  If you are still having any occasional heartburn in the evenings, try using an antacid such as tums or mylanta--these are faster acting then taking a preventative medication.  If needed, you can dose the medication twice daily (once before breakfast and once before dinner)--especially if you know you are going to have a meal that has triggered heartburn in the past.  Continue the wellbutrin along with zoloft, long-term.  Ritalin--take 1 tablet in the morning.  If you feel too jittery, then cut it in half.  If you find that it is wearing off and you cannot complete your work with just the morning dose, then try taking 1/2 tablet no later than 2-3 pm.  If that isn't effective, you can take the full tablet in the evening, but you are at higher risk for having trouble sleeping.

## 2015-01-19 NOTE — Progress Notes (Signed)
Chief Complaint  Patient presents with  . Follow-up    on moods.    Patient presents to follow up on her depression.  She started Wellbutrin 6 weeks ago in addition to continuing zoloft at 150mg  daily.  It took about a month to notice the improvement, but now her moods are much better. She denies any side effects.  She doesn't think it made much difference with respect to ADD and ability to concentrate on detailed work at work. She would like to be put back on ritalin.  She recalls being on 10mg  dose, using just short-acting medication.  Sleeping fine, improved--not needing the unisom as often, and no insomnia due to the wellbutrin.  Flexeril helps, back is doing better.  GERD:  occasionally she has some breakthrough hearburn, not adequately treated with the OTC Prevacid.  She occasionally has taken a zantac when needed, with good results. This heartburn is usually in the evening, after dinner. Doesn't have symptoms during the night.  PMH, PSH, SH reviewed.  Outpatient Encounter Prescriptions as of 01/19/2015  Medication Sig Note  . buPROPion (WELLBUTRIN XL) 150 MG 24 hr tablet Take 1 tablet (150 mg total) by mouth daily.   . calcium carbonate (OS-CAL) 600 MG TABS tablet Take 600 mg by mouth daily with breakfast.   . doxylamine, Sleep, (UNISOM) 25 MG tablet Take 25 mg by mouth at bedtime as needed.   Marland Kitchen estradiol (ESTRACE) 0.5 MG tablet TAKE 1 TABLET (0.5 MG TOTAL) BY MOUTH DAILY.   Marland Kitchen lansoprazole (PREVACID) 15 MG capsule Take 15 mg by mouth daily at 12 noon.   . medroxyPROGESTERone (PROVERA) 5 MG tablet Take 1 tablet (5 mg total) by mouth daily.   . sertraline (ZOLOFT) 100 MG tablet 1 1/2 to 2 tablets daily 11/30/2014: Take 1-1.5 tablets each day  . [DISCONTINUED] buPROPion (WELLBUTRIN XL) 150 MG 24 hr tablet Take 1 tablet (150 mg total) by mouth daily.   . cyclobenzaprine (FLEXERIL) 10 MG tablet Take 1 tablet (10 mg total) by mouth 3 (three) times daily as needed for muscle spasms. (Patient  not taking: Reported on 01/19/2015)   . methylphenidate (RITALIN) 10 MG tablet Take 1/2 to 1 tablet by mouth twice daily as directed   . naproxen (NAPROSYN) 500 MG tablet Take 1 tablet (500 mg total) by mouth 2 (two) times daily with a meal. Use for a week at a time, then as needed (Patient not taking: Reported on 01/19/2015)   . pantoprazole (PROTONIX) 40 MG tablet Take 1 tablet (40 mg total) by mouth daily before breakfast.    No facility-administered encounter medications on file as of 01/19/2015.   Allergies  Allergen Reactions  . Phenergan [Promethazine Hcl] Other (See Comments)    Loss consciousness  (ritalin started today, not prior to visit)  Allergies  Allergen Reactions  . Phenergan [Promethazine Hcl] Other (See Comments)    Loss consciousness    ROS:  No fever, chills, anorexia.  Very slight weight gain noted. No URI symptoms, headaches, dizziness, chest pain.  +LBP, improved.  Reflux improved.  No nausea, vomiting, bowel changes, bleeding, bruising, rash.  See HPI.  PHYSICAL EXAM: BP 130/76 mmHg  Pulse 68  Ht 5\' 8"  (1.727 m)  Wt 161 lb 3.2 oz (73.12 kg)  BMI 24.52 kg/m2  LMP 09/01/2012  Well developed, well-appearing, calm female in no distress Normal mood, affect, hygiene and grooming. Normal speech, eye contact.   No hyperactivity. Alert and oriented.  Cranial nerves, gait are normal. Visit  was limited to discussion and counseinig.  ASSESSMENT/PLAN:  Major depression, chronic - significantly improved with the addition of Wellbutrin. Continue Wellbutrin and zoloft 150mg . - Plan: buPROPion (WELLBUTRIN XL) 150 MG 24 hr tablet  Gastroesophageal reflux disease without esophagitis - change to rx PPI (generic), used once daily in the morning before breakfast.  Consider BID dose prn. diet reviewed. short-acting meds reviewed. - Plan: pantoprazole (PROTONIX) 40 MG tablet  ADD (attention deficit disorder) without hyperactivity - risks/side effects of ritalin reviewed. start  with short-acting, 10mg  in am, and 1/2 tablet if needed in afternoon (less if side effects) - Plan: methylphenidate (RITALIN) 10 MG tablet   Change to rx pantoprazole once daily in the morning.  Stop other PPI's (vs BID prn of the OTC).  Discussed Ritalin risks/side effects, and how to take.  Start with 10mg  in am; cut back to 1/2 tablet if having side effects.  Try and get most challenging work done earlier in the day. Use 1/2 tablet after 2-3pm only if needed (and titrate up if needed).    Flu shot given today  F/u 6 months. To contact us with how the ritalin is working, and when refills are needed. Risk/side effects of all meds reviewed.  30 min visit, more than 1/2 spent counseling.

## 2015-01-23 NOTE — Addendum Note (Signed)
Addended by: Carolee Rota F on: 01/23/2015 11:31 AM   Modules accepted: Orders

## 2015-01-31 ENCOUNTER — Other Ambulatory Visit: Payer: Self-pay | Admitting: Family Medicine

## 2015-01-31 ENCOUNTER — Encounter: Payer: Self-pay | Admitting: Family Medicine

## 2015-02-16 ENCOUNTER — Ambulatory Visit: Payer: Self-pay | Admitting: Obstetrics & Gynecology

## 2015-03-13 ENCOUNTER — Other Ambulatory Visit: Payer: Self-pay | Admitting: Family Medicine

## 2015-03-14 NOTE — Telephone Encounter (Signed)
Is this okay to refill? 

## 2015-03-14 NOTE — Telephone Encounter (Signed)
done

## 2015-03-15 ENCOUNTER — Ambulatory Visit: Payer: Self-pay | Admitting: Obstetrics and Gynecology

## 2015-03-29 ENCOUNTER — Other Ambulatory Visit: Payer: Self-pay | Admitting: Family Medicine

## 2015-03-29 DIAGNOSIS — F988 Other specified behavioral and emotional disorders with onset usually occurring in childhood and adolescence: Secondary | ICD-10-CM

## 2015-03-29 MED ORDER — METHYLPHENIDATE HCL 10 MG PO TABS: ORAL_TABLET | ORAL | Status: DC

## 2015-03-29 MED ORDER — METHYLPHENIDATE HCL 10 MG PO TABS
ORAL_TABLET | ORAL | Status: DC
Start: 2015-03-29 — End: 2015-10-25

## 2015-03-29 NOTE — Addendum Note (Signed)
Addended byRita Ohara on: 03/29/2015 01:19 PM   Modules accepted: Orders

## 2015-05-23 ENCOUNTER — Ambulatory Visit (INDEPENDENT_AMBULATORY_CARE_PROVIDER_SITE_OTHER): Payer: Commercial Managed Care - HMO | Admitting: Obstetrics & Gynecology

## 2015-05-23 ENCOUNTER — Encounter: Payer: Self-pay | Admitting: Obstetrics & Gynecology

## 2015-05-23 VITALS — BP 130/66 | HR 74 | Resp 18 | Ht 67.75 in | Wt 160.0 lb

## 2015-05-23 DIAGNOSIS — Z0000 Encounter for general adult medical examination without abnormal findings: Secondary | ICD-10-CM | POA: Diagnosis not present

## 2015-05-23 DIAGNOSIS — R3129 Other microscopic hematuria: Secondary | ICD-10-CM | POA: Diagnosis not present

## 2015-05-23 DIAGNOSIS — Z124 Encounter for screening for malignant neoplasm of cervix: Secondary | ICD-10-CM

## 2015-05-23 DIAGNOSIS — Z205 Contact with and (suspected) exposure to viral hepatitis: Secondary | ICD-10-CM

## 2015-05-23 DIAGNOSIS — Z23 Encounter for immunization: Secondary | ICD-10-CM

## 2015-05-23 DIAGNOSIS — Z01419 Encounter for gynecological examination (general) (routine) without abnormal findings: Secondary | ICD-10-CM

## 2015-05-23 LAB — LIPID PANEL
CHOLESTEROL: 209 mg/dL — AB (ref 125–200)
HDL: 61 mg/dL (ref >=46)
LDL Cholesterol: 106 mg/dL (ref <130)
TRIGLYCERIDES: 209 mg/dL — AB (ref <150)
Total CHOL/HDL Ratio: 3.4 Ratio (ref <=5.0)
VLDL: 42 mg/dL — AB (ref <30)

## 2015-05-23 LAB — POCT URINALYSIS DIPSTICK
Bilirubin, UA: NEGATIVE
Glucose, UA: NEGATIVE
Ketones, UA: NEGATIVE
NITRITE UA: NEGATIVE
PROTEIN UA: NEGATIVE
UROBILINOGEN UA: NEGATIVE
pH, UA: 7

## 2015-05-23 LAB — COMPREHENSIVE METABOLIC PANEL
ALK PHOS: 57 U/L (ref 33–130)
ALT: 10 U/L (ref 6–29)
AST: 15 U/L (ref 10–35)
Albumin: 4.7 g/dL (ref 3.6–5.1)
BILIRUBIN TOTAL: 0.4 mg/dL (ref 0.2–1.2)
BUN: 12 mg/dL (ref 7–25)
CHLORIDE: 101 mmol/L (ref 98–110)
CO2: 26 mmol/L (ref 20–31)
CREATININE: 0.84 mg/dL (ref 0.50–1.05)
Calcium: 9.4 mg/dL (ref 8.6–10.4)
Glucose, Bld: 77 mg/dL (ref 65–99)
Potassium: 4.8 mmol/L (ref 3.5–5.3)
SODIUM: 137 mmol/L (ref 135–146)
TOTAL PROTEIN: 6.9 g/dL (ref 6.1–8.1)

## 2015-05-23 MED ORDER — ESTRADIOL 0.5 MG PO TABS: ORAL_TABLET | ORAL | Status: DC

## 2015-05-23 MED ORDER — HYDROCORTISONE 2.5 % RE CREA: 1.0000 "application " | TOPICAL_CREAM | Freq: Two times a day (BID) | RECTAL | Status: DC

## 2015-05-23 MED ORDER — MEDROXYPROGESTERONE ACETATE 5 MG PO TABS: 5.0000 mg | ORAL_TABLET | Freq: Every day | ORAL | Status: DC

## 2015-05-23 NOTE — Progress Notes (Signed)
54 y.o. [id number] MarriedCaucasianF here for annual exam.  Pt reports having an episode of bleeding about 10 days ago.  This lasted about six days.  Some of this was with clots but wasn't very heavy.  This was the only bleeding this year.  She is having some increased hot flashes.    Had [facility]/21/14.  Pt has small fibroid on ultrasound.  Endometrial biopsy was negative for abnormal cells.    Patient's last menstrual period was 09/01/2012.          Sexually active: Yes.    The current method of family planning is vasectomy.    Exercising: Yes.    5 days a week Smoker:  no  Health Maintenance: Pap:  02/19/12 Neg History of abnormal Pap:  no MMG:  09/12/14 BIRADS1:neg Colonoscopy:  04/22/12 Polyps - repeat 5 years  BMD:   Never TDaP:  Unsure Screening Labs: here today, Hb today: pending, Urine today: WBC=Large, RBC=Mod   reports that she has never smoked. She has never used smokeless tobacco. She reports that she drinks about 3.0 oz of alcohol per week. She reports that she does not use illicit drugs.  Past Medical History  Diagnosis Date  . Yeast infection     history   . Postpartum depression   . Seasonal allergies   . Low back pain     chronic "back aches"  . Postmenopausal   . Melanoma ([place]) end of 2014    left leg    Past Surgical History  Procedure Laterality Date  . Nose surgery    . Cesarean section    . Wisdom tooth extraction    . Melanoma excision Left     Leg    Current Outpatient Prescriptions  Medication Sig Dispense Refill  . buPROPion (WELLBUTRIN XL) 150 MG 24 hr tablet Take 1 tablet (150 mg total) by mouth daily. 90 tablet 1  . calcium carbonate (OS-CAL) 600 MG TABS tablet Take 600 mg by mouth daily with breakfast.    . cyclobenzaprine (FLEXERIL) 10 MG tablet TAKE 1 TABLET (10 MG TOTAL) BY MOUTH 3 (THREE) TIMES DAILY AS NEEDED FOR MUSCLE SPASMS. 30 tablet 0  . doxylamine, Sleep, (UNISOM) 25 MG tablet Take 25 mg by mouth at bedtime as needed.    Marland Kitchen estradiol  (ESTRACE) 0.5 MG tablet TAKE 1 TABLET (0.5 MG TOTAL) BY MOUTH DAILY. 90 tablet 3  . lansoprazole (PREVACID) 15 MG capsule Take 15 mg by mouth daily at 12 noon.    . medroxyPROGESTERone (PROVERA) 5 MG tablet Take 1 tablet (5 mg total) by mouth daily. 90 tablet 3  . methylphenidate (RITALIN) 10 MG tablet Take 1/2 to 1 tablet by mouth twice daily as directed 60 tablet 0  . naproxen (NAPROSYN) 500 MG tablet Take 1 tablet (500 mg total) by mouth 2 (two) times daily with a meal. Use for a week at a time, then as needed 60 tablet 0  . pantoprazole (PROTONIX) 40 MG tablet Take 1 tablet (40 mg total) by mouth daily before breakfast. 30 tablet 5  . sertraline (ZOLOFT) 100 MG tablet 1 1/2 to 2 tablets daily 180 tablet 3   No current facility-administered medications for this visit.    Family History  Problem Relation Age of Onset  . Emphysema Mother   . Hypertension Mother   . Migraines Mother   . Throat cancer Father     nonsmoker, h/o reflux  . Testicular cancer Father   . Colitis Sister   .  Diabetes Neg Hx   . Depression Sister     ROS:  Pertinent items are noted in HPI.  Otherwise, a comprehensive ROS was negative.  Exam:   BP 130/66 mmHg  Pulse 74  Resp 18  Ht 5' 7.75" (1.721 m)  Wt 160 lb (72.576 kg)  BMI 24.50 kg/m2  LMP 09/01/2012  Weight change: +7#  Height: 5' 7.75" (172.1 cm)  Ht Readings from Last 3 Encounters:  05/23/15 5' 7.75" (1.721 m)  01/19/15 5\' 8"  (1.727 m)  11/30/14 5\' 8"  (1.727 m)    General appearance: alert, cooperative and appears stated age Head: Normocephalic, without obvious abnormality, atraumatic Neck: no adenopathy, supple, symmetrical, trachea midline and thyroid normal to inspection and palpation Breasts: normal appearance, no masses or tenderness Abdomen: soft, non-tender; bowel sounds normal; no masses,  no organomegaly Extremities: extremities normal, atraumatic, no cyanosis or edema Skin: Skin color, texture, turgor normal. No rashes or  lesions Lymph nodes: Cervical, supraclavicular, and axillary nodes normal. No abnormal inguinal nodes palpated Neurologic: Grossly normal   Pelvic: External genitalia:  no lesions              Urethra:  normal appearing urethra with no masses, tenderness or lesions              Bartholins and Skenes: normal                 Vagina: normal appearing vagina with normal color and discharge, no lesions              Cervix: no lesions              Pap taken: Yes.   Bimanual Exam:  Uterus:  normal size, contour, position, consistency, mobility, non-tender              Adnexa: normal adnexa and no mass, fullness, tenderness               Rectovaginal: Confirms               Anus:  normal sphincter tone, enlarged and bleeding hemorrhoid noted, non-thrombosed  Chaperone was present for exam.  A:  Well Woman with normal exam On HRT, with single episode of bleeding this year.  [facility] and biopsy 11/15. Hemorrhoids Needs Tdap update Depression GERD Microscopic hematuria that I suspect is from her hemorrhoid today  P:   Mammogram yearly pap smear with HR HPV obtained today Tdap given today TSH, Lipids, CMP, Vit D today Hep C antibody today Anusol HC 2.5 %BID for up to 7 days for hemorrhoids.  D/W pt excision vs banding procedure.  She is not sure she wants to proceed with either.  Will try the steroid first and let me know if desires referral. Pt knows to call with any future bleeding.   return annually or prn

## 2015-05-24 LAB — URINALYSIS, MICROSCOPIC ONLY
CRYSTALS: NONE SEEN [HPF]
Casts: NONE SEEN [LPF]
RBC / HPF: NONE SEEN RBC/HPF (ref <=2)
YEAST: NONE SEEN [HPF]

## 2015-05-24 LAB — HEMOGLOBIN, FINGERSTICK: Hemoglobin, fingerstick: 13.4 g/dL (ref 12.0–16.0)

## 2015-05-24 LAB — VITAMIN D 25 HYDROXY (VIT D DEFICIENCY, FRACTURES): Vit D, 25-Hydroxy: 26 ng/mL — ABNORMAL LOW (ref 30–100)

## 2015-05-24 LAB — TSH: TSH: 0.834 u[IU]/mL (ref 0.350–4.500)

## 2015-05-24 LAB — HEPATITIS C ANTIBODY: HCV AB: NEGATIVE

## 2015-05-25 LAB — IPS PAP TEST WITH HPV

## 2015-05-25 LAB — URINE CULTURE
Colony Count: NO GROWTH
ORGANISM ID, BACTERIA: NO GROWTH

## 2015-05-30 ENCOUNTER — Telehealth: Payer: Self-pay | Admitting: Emergency Medicine

## 2015-05-30 NOTE — Telephone Encounter (Signed)
Patient notified of results. Lab work and pap smear discussed. Patient agreeable to starting OTC Vitamin D 2000 international units daily and will follow up for repeat fasting cholesterol in one year.  Routing to provider for final review. Patient agreeable to disposition. Will close encounter.

## 2015-05-30 NOTE — Telephone Encounter (Signed)
-----   Message from Megan Salon, MD sent at 05/26/2015  2:20 PM EST ----- Please inform pt that her TSH was normal, Hep C ab was negative.  Her Vit D is low at 57.  She needs to be taking 2000 IU OTC of Vit D daily.  Her CMP was normal.  Her lipids were mildly elevated but this wasn't a fasting test.  Repeat 1 year and do it fasting.  Lastly her pap was negative and the HR HPV testing was negative. 02 recall.

## 2015-06-21 ENCOUNTER — Other Ambulatory Visit: Payer: Self-pay | Admitting: Family Medicine

## 2015-06-21 ENCOUNTER — Other Ambulatory Visit: Payer: Self-pay | Admitting: *Deleted

## 2015-06-21 DIAGNOSIS — M545 Low back pain, unspecified: Secondary | ICD-10-CM

## 2015-06-21 MED ORDER — NAPROXEN 500 MG PO TABS: 500.0000 mg | ORAL_TABLET | Freq: Two times a day (BID) | ORAL | Status: DC

## 2015-06-26 ENCOUNTER — Telehealth: Payer: Self-pay | Admitting: Obstetrics & Gynecology

## 2015-06-26 DIAGNOSIS — K648 Other hemorrhoids: Secondary | ICD-10-CM

## 2015-06-26 NOTE — Telephone Encounter (Signed)
Patient calling requesting a referral to "a GI doctor."

## 2015-06-27 NOTE — Telephone Encounter (Signed)
Referral faxed and placed into EPIC.  Will close encounter.

## 2015-06-27 NOTE — Telephone Encounter (Signed)
Yes.  That is fine.  Thanks

## 2015-06-27 NOTE — Telephone Encounter (Signed)
Dr. Sabra Heck,  Patient is requesting a referral for hemorrhoid banding procedure. Okay to refer to Dr. Earlean Shawl?

## 2015-06-29 ENCOUNTER — Telehealth: Payer: Self-pay | Admitting: Obstetrics & Gynecology

## 2015-06-29 NOTE — Telephone Encounter (Signed)
Spoke with patient to convey appointment information to a specialist

## 2015-07-05 DIAGNOSIS — K648 Other hemorrhoids: Secondary | ICD-10-CM

## 2015-07-05 HISTORY — DX: Other hemorrhoids: K64.8

## 2015-07-06 ENCOUNTER — Encounter: Payer: Self-pay | Admitting: *Deleted

## 2015-08-01 ENCOUNTER — Other Ambulatory Visit: Payer: Self-pay | Admitting: Obstetrics & Gynecology

## 2015-08-01 NOTE — Telephone Encounter (Signed)
Medication refill request: Sertraline 100mg    Last AEX:  05-23-15 Next AEX: 09-17-16 Last MMG (if hormonal medication request): 09-12-14 Bilateral, Category D, Bi-Rads 1 Neg Refill authorized: Please advise on approval or denial of Rx request. Thank you.

## 2015-09-04 ENCOUNTER — Ambulatory Visit (INDEPENDENT_AMBULATORY_CARE_PROVIDER_SITE_OTHER): Payer: Commercial Managed Care - HMO | Admitting: Obstetrics & Gynecology

## 2015-09-04 ENCOUNTER — Encounter: Payer: Self-pay | Admitting: Obstetrics & Gynecology

## 2015-09-04 VITALS — BP 110/80 | HR 76 | Temp 98.1°F | Resp 16 | Ht 67.75 in | Wt 158.0 lb

## 2015-09-04 DIAGNOSIS — Z79890 Hormone replacement therapy (postmenopausal): Secondary | ICD-10-CM | POA: Diagnosis not present

## 2015-09-04 DIAGNOSIS — R3129 Other microscopic hematuria: Secondary | ICD-10-CM | POA: Diagnosis not present

## 2015-09-04 DIAGNOSIS — N390 Urinary tract infection, site not specified: Secondary | ICD-10-CM | POA: Diagnosis not present

## 2015-09-04 DIAGNOSIS — R300 Dysuria: Secondary | ICD-10-CM | POA: Diagnosis not present

## 2015-09-04 DIAGNOSIS — R82998 Other abnormal findings in urine: Secondary | ICD-10-CM

## 2015-09-04 LAB — POCT URINALYSIS DIPSTICK
BILIRUBIN UA: NEGATIVE
Glucose, UA: NEGATIVE
Ketones, UA: NEGATIVE
NITRITE UA: NEGATIVE
PH UA: 5
PROTEIN UA: NEGATIVE
Urobilinogen, UA: NEGATIVE

## 2015-09-04 MED ORDER — SULFAMETHOXAZOLE-TRIMETHOPRIM 800-160 MG PO TABS: 1.0000 | ORAL_TABLET | Freq: Two times a day (BID) | ORAL | Status: DC

## 2015-09-04 MED ORDER — PHENAZOPYRIDINE HCL 100 MG PO TABS: 100.0000 mg | ORAL_TABLET | Freq: Three times a day (TID) | ORAL | Status: DC | PRN

## 2015-09-04 NOTE — Progress Notes (Signed)
GYNECOLOGY  VISIT   HPI: 56 y.o. [id number] Married Caucasian female with about a seven day complaint of increased urinary pressure, urgency, frequency and dysuria.  Pt denies fever or back pain.  She's seen no blood in her urine.  She does not have a hx of kidney stones.  Pt reports nothing has really changed with her personally--does drink water.  Has not tried anything OTC for this.  Feels like she is getting more uncomfortable by the hour today.  GYNECOLOGIC HISTORY: Patient's last menstrual period was 09/01/2012. Menopausal hormone therapy: estradiol 0.5mg  and provera 5mg  daily  Patient Active Problem List   Diagnosis Date Noted  . Gastroesophageal reflux disease without esophagitis 12/07/2014  . Major depression, chronic ([place]) 12/07/2014  . ADD (attention deficit disorder) 12/07/2014  . Bilateral low back pain without sciatica 12/07/2014  . Menopausal symptom 12/08/2013    Past Medical History  Diagnosis Date  . Yeast infection     history   . Postpartum depression   . Seasonal allergies   . Low back pain     chronic "back aches"  . Postmenopausal   . Melanoma ([place]) end of 2014    left leg  . Hemorrhoids, internal 07/05/15    banding;Dr.Medoff    Past Surgical History  Procedure Laterality Date  . Nose surgery    . Cesarean section    . Wisdom tooth extraction    . Melanoma excision Left     Leg    MEDS:  Reviewed in EPIC and UTD  ALLERGIES: Phenergan  Family History  Problem Relation Age of Onset  . Emphysema Mother   . Hypertension Mother   . Migraines Mother   . Throat cancer Father     nonsmoker, h/o reflux  . Testicular cancer Father   . Colitis Sister   . Diabetes Neg Hx   . Depression Sister     SH:  Married, non smoker  ROS  PHYSICAL EXAMINATION:    BP 110/80 mmHg  Pulse 76  Temp(Src) 98.1 F (36.7 C) (Oral)  Resp 16  Ht 5' 7.75" (1.721 m)  Wt 158 lb (71.668 kg)  BMI 24.20 kg/m2  LMP 09/01/2012    General appearance: alert,  cooperative and appears stated age Flank:  No CVA tenderness Abdomen: soft, + suprapubic tenderness, bowel sounds normal; no masses, no organomegaly  Pelvic: External genitalia:  no lesions              Urethra:  normal appearing urethra with no masses, tenderness or lesions              Bartholins and Skenes: normal                 Vagina: normal appearing vagina with normal color and discharge, no lesions               Chaperone was present for exam.  Assessment: UTI Microscopic hematuria On HRT  Plan: Urine culture pending.  Will treat with Bactrim DS bid x 5 days.  Pt will return for repeat micro and culture due to the microscopic hematuria.

## 2015-09-05 LAB — URINE CULTURE

## 2015-09-05 LAB — URINALYSIS, MICROSCOPIC ONLY
CASTS: NONE SEEN [LPF]
CRYSTALS: NONE SEEN [HPF]
RBC / HPF: NONE SEEN RBC/HPF (ref <=2)
YEAST: NONE SEEN [HPF]

## 2015-09-07 ENCOUNTER — Telehealth: Payer: Self-pay | Admitting: *Deleted

## 2015-09-07 DIAGNOSIS — Z79890 Hormone replacement therapy (postmenopausal): Secondary | ICD-10-CM | POA: Insufficient documentation

## 2015-09-07 NOTE — Telephone Encounter (Signed)
Patient calling checking to see if results are in from her most recent visit. Best # to reach: [phone]

## 2015-09-07 NOTE — Telephone Encounter (Signed)
Routing to [place] for review and advise of results from 09/04/2015. Per review urine culture is negative. Patient was started on Bactrim DS x 5 days at [facility] on 09/04/2015. Please advise next steps.  Plan: Urine culture pending. Will treat with Bactrim DS bid x 5 days. Pt will return for repeat micro and culture due to the microscopic hematuria.

## 2015-09-08 ENCOUNTER — Other Ambulatory Visit: Payer: Self-pay | Admitting: Obstetrics & Gynecology

## 2015-09-08 DIAGNOSIS — R82998 Other abnormal findings in urine: Secondary | ICD-10-CM

## 2015-09-08 NOTE — Telephone Encounter (Signed)
Call to patient. Results and instructions given as directed by Dr Sabra Heck. Lab appointment scheduled for 09-14-15 at 1130 for repeat urine micro. Patient agreeable.  Encounter closed.

## 2015-09-08 NOTE — Telephone Encounter (Signed)
Inform urine culture is negative but I'd like her to finish all of them.  I would repeat the urine micro next week.  Order has been placed for this.  If this was still abnormal, esp with WBCs, would refer to urology.

## 2015-09-10 NOTE — Addendum Note (Signed)
Addended by: Megan Salon on: 09/10/2015 03:02 PM   Modules accepted: Orders, SmartSet

## 2015-09-11 ENCOUNTER — Other Ambulatory Visit: Payer: Self-pay

## 2015-09-11 DIAGNOSIS — Z1231 Encounter for screening mammogram for malignant neoplasm of breast: Secondary | ICD-10-CM

## 2015-09-14 ENCOUNTER — Ambulatory Visit: Payer: Commercial Managed Care - HMO

## 2015-09-19 ENCOUNTER — Other Ambulatory Visit: Payer: Self-pay | Admitting: Family Medicine

## 2015-09-19 ENCOUNTER — Ambulatory Visit (INDEPENDENT_AMBULATORY_CARE_PROVIDER_SITE_OTHER): Payer: Commercial Managed Care - HMO

## 2015-09-19 DIAGNOSIS — Z87440 Personal history of urinary (tract) infections: Secondary | ICD-10-CM

## 2015-09-19 DIAGNOSIS — R82998 Other abnormal findings in urine: Secondary | ICD-10-CM

## 2015-09-19 DIAGNOSIS — N390 Urinary tract infection, site not specified: Secondary | ICD-10-CM | POA: Diagnosis not present

## 2015-09-19 NOTE — Progress Notes (Signed)
Pt came in for toc urine micro today.per patient no problems.

## 2015-09-20 ENCOUNTER — Encounter: Payer: Self-pay | Admitting: Family Medicine

## 2015-09-20 LAB — URINALYSIS, MICROSCOPIC ONLY
BACTERIA UA: NONE SEEN [HPF]
Casts: NONE SEEN [LPF]
Crystals: NONE SEEN [HPF]
Yeast: NONE SEEN [HPF]

## 2015-09-21 ENCOUNTER — Other Ambulatory Visit: Payer: Self-pay | Admitting: Family Medicine

## 2015-09-21 ENCOUNTER — Telehealth: Payer: Self-pay | Admitting: Family Medicine

## 2015-09-21 MED ORDER — PANTOPRAZOLE SODIUM 40 MG PO TBEC: 40.0000 mg | DELAYED_RELEASE_TABLET | Freq: Every day | ORAL | Status: DC

## 2015-09-21 NOTE — Telephone Encounter (Signed)
Recieved notice from pharmacy Methylphenidate requires P.A. This was completed

## 2015-09-21 NOTE — Telephone Encounter (Signed)
P.A. METHYLPHENIDATE approved til 09/20/16, pt informed, faxed pharmacy

## 2015-09-21 NOTE — Addendum Note (Signed)
Addended by: Carolee Rota F on: 09/21/2015 09:20 AM   Modules accepted: Orders

## 2015-09-22 ENCOUNTER — Ambulatory Visit: Payer: Commercial Managed Care - HMO | Admitting: Family Medicine

## 2015-09-28 ENCOUNTER — Ambulatory Visit: Payer: BLUE CROSS/BLUE SHIELD

## 2015-10-03 ENCOUNTER — Other Ambulatory Visit: Payer: Self-pay | Admitting: *Deleted

## 2015-10-03 DIAGNOSIS — F329 Major depressive disorder, single episode, unspecified: Secondary | ICD-10-CM

## 2015-10-03 MED ORDER — BUPROPION HCL ER (XL) 150 MG PO TB24: 150.0000 mg | ORAL_TABLET | Freq: Every day | ORAL | Status: DC

## 2015-10-11 ENCOUNTER — Ambulatory Visit: Payer: BLUE CROSS/BLUE SHIELD

## 2015-10-16 ENCOUNTER — Ambulatory Visit
Admission: RE | Admit: 2015-10-16 | Discharge: 2015-10-16 | Disposition: A | Discharge status: Home / Self Care | Payer: Commercial Managed Care - HMO | Source: Ambulatory Visit

## 2015-10-16 DIAGNOSIS — Z1231 Encounter for screening mammogram for malignant neoplasm of breast: Secondary | ICD-10-CM

## 2015-10-25 ENCOUNTER — Encounter: Payer: Self-pay | Admitting: Family Medicine

## 2015-10-25 ENCOUNTER — Ambulatory Visit (INDEPENDENT_AMBULATORY_CARE_PROVIDER_SITE_OTHER): Payer: Commercial Managed Care - HMO | Admitting: Family Medicine

## 2015-10-25 VITALS — BP 122/72 | HR 80 | Ht 68.0 in | Wt 160.8 lb

## 2015-10-25 DIAGNOSIS — F900 Attention-deficit hyperactivity disorder, predominantly inattentive type: Secondary | ICD-10-CM | POA: Diagnosis not present

## 2015-10-25 DIAGNOSIS — K219 Gastro-esophageal reflux disease without esophagitis: Secondary | ICD-10-CM | POA: Diagnosis not present

## 2015-10-25 DIAGNOSIS — E559 Vitamin D deficiency, unspecified: Secondary | ICD-10-CM

## 2015-10-25 DIAGNOSIS — E781 Pure hyperglyceridemia: Secondary | ICD-10-CM

## 2015-10-25 DIAGNOSIS — F329 Major depressive disorder, single episode, unspecified: Secondary | ICD-10-CM | POA: Diagnosis not present

## 2015-10-25 DIAGNOSIS — F988 Other specified behavioral and emotional disorders with onset usually occurring in childhood and adolescence: Secondary | ICD-10-CM

## 2015-10-25 MED ORDER — METHYLPHENIDATE HCL 10 MG PO TABS: ORAL_TABLET | ORAL | Status: DC

## 2015-10-25 MED ORDER — PANTOPRAZOLE SODIUM 40 MG PO TBEC: 40.0000 mg | DELAYED_RELEASE_TABLET | Freq: Every day | ORAL | Status: DC

## 2015-10-25 MED ORDER — BUPROPION HCL ER (XL) 150 MG PO TB24: 150.0000 mg | ORAL_TABLET | Freq: Every day | ORAL | Status: DC

## 2015-10-25 NOTE — Patient Instructions (Signed)
   Alternatives to taking pantoprazole daily (or every other day, if that is working well for you) would be ranitidine (zantac) 150 mg twice daily or famotidine (Pepcid) 20mg  twice daily.  These medications are H2 blockers, not proton pump inhibitors, and may have fewer risks as far as the bones go. Continuing to avoid foods which contribute to reflux symptoms, and using the medications as needed prior to these foods/meals can also be helpful. Just be sure to adequately treat your reflux--don't be afraid of the medications and live with daily symptoms, as untreated reflux can also have bad consequences.   Please start taking 1000 IU of Vitamin D3 every day.  Continue your current medications.  Return in 9 months--after your physical with GYN. Return sooner if you are having any problems or concerns.

## 2015-10-25 NOTE — Progress Notes (Signed)
Chief Complaint  Patient presents with  . Depression    and ADD med check. no concerns.    Depression:  She has been taking zoloft 150mg  and Wellbutrin since July 2016.  She denies side effects.  ADD:  She was started back on Ritalin in 01/2015. She takes 10mg  most workdays.  On a few occasions she took 15mg , but usually just takes 10mg  with good results. She reports that the medication only lasts 4 hours, so she usually takes it around 1pm (morning work isn't too difficult). Denies side effects--no tics, insomnia  GERD:  Changed to pantoprazole in 01/2015. She is very worried about possible longterm effects, so has only been taking it about every other day.  Asking about "safer" options such as zantac.  Denies heartburn on the days she skips it (but won't skip it if eating foods/alcohol which will trigger).  Denies dysphagia  Chart reviewed: She had UTI in April, treated by her GYN with Bactrim DS. Culture showed 60K of multiple types. Repeat urine was normal. Symptoms completely resolved with the ABX  Vitamin D was noted to be low at 26 in 05/2015 (ordered by GYN). She is not taking a vitamin D supplement.  Elevated TG noted on 05/2015 labs: Lab Results  Component Value Date   CHOL 209* 05/23/2015   HDL 61 05/23/2015   LDLCALC 106 05/23/2015   TRIG 209* 05/23/2015   CHOLHDL 3.4 05/23/2015    PMH, PSH, SH and FH reviewed/updated. Daughter recently transitioned--transgender.  Will be going to [place] next year for [employer].  Outpatient Encounter Prescriptions as of 10/25/2015  Medication Sig Note  . buPROPion (WELLBUTRIN XL) 150 MG 24 hr tablet Take 1 tablet (150 mg total) by mouth daily.   . calcium carbonate (OS-CAL) 600 MG TABS tablet Take 600 mg by mouth daily with breakfast.   . cyclobenzaprine (FLEXERIL) 10 MG tablet TAKE 1 TABLET (10 MG TOTAL) BY MOUTH 3 (THREE) TIMES DAILY AS NEEDED FOR MUSCLE SPASMS.   Marland Kitchen doxylamine, Sleep, (UNISOM) 25 MG tablet Take 25 mg by mouth at  bedtime as needed.   Marland Kitchen estradiol (ESTRACE) 0.5 MG tablet TAKE 1 TABLET (0.5 MG TOTAL) BY MOUTH DAILY.   . hydrocortisone (ANUSOL-HC) 2.5 % rectal cream Place 1 application rectally 2 (two) times daily.   . medroxyPROGESTERone (PROVERA) 5 MG tablet Take 1 tablet (5 mg total) by mouth daily.   . methylphenidate (RITALIN) 10 MG tablet Take 1/2 to 1 tablet by mouth twice daily as directed 10/25/2015: Takes 1 tablet most work-days   . naproxen (NAPROSYN) 500 MG tablet Take 1 tablet (500 mg total) by mouth 2 (two) times daily with a meal. Use for a week at a time, then as needed   . pantoprazole (PROTONIX) 40 MG tablet Take 1 tablet (40 mg total) by mouth daily. 10/25/2015: Taking every other day  . sertraline (ZOLOFT) 100 MG tablet TAKE 1 AND 1/2 TO 2 TABLETS DAILY   . [DISCONTINUED] lansoprazole (PREVACID) 15 MG capsule Take 15 mg by mouth daily at 12 noon. Reported on 10/25/2015   . [DISCONTINUED] phenazopyridine (PYRIDIUM) 100 MG tablet Take 1 tablet (100 mg total) by mouth 3 (three) times daily as needed for pain.   . [DISCONTINUED] sulfamethoxazole-trimethoprim (BACTRIM DS,SEPTRA DS) 800-160 MG tablet Take 1 tablet by mouth 2 (two) times daily.    No facility-administered encounter medications on file as of 10/25/2015.   Sporadic vitamin D, unsure of dose  Allergies  Allergen Reactions  . Phenergan [  Promethazine Hcl] Other (See Comments)    Loss consciousness    ROS: no fever, chills, only mild hay fever.  No other URI symptoms, cough, shortness of breath, headaches, dizziness, chest pain, palpitations, GI or GU complaints, bleeding, bruising, rash, depression, anxiety.  See HPI  PHYSICAL EXAM:  BP 122/72 mmHg  Pulse 80  Ht 5\' 8"  (1.727 m)  Wt 160 lb 12.8 oz (72.938 kg)  BMI 24.46 kg/m2  LMP 09/01/2012  Well developed, pleasant female in no distress HEENT: PERRL, EOMI, conjunctiva and sclera are clear. Neck: no lymphadenopathy, thyromegaly or mass Heart: regular rate and  rhythm Lungs: clear bilaterally Back: no spinal or CVA tenderness Abdomen: soft, nontender, no organomegaly or mass Extremities: no edema, normal pulses Psych: normal mood, affect, hygiene and grooming Neuro: alert and oriented, cranial nerves intact, normal strength, gait  ASSESSMENT/PLAN:  Major depression, chronic (HCC) - well controlled on Wellbutrin and zoloft 150mg . - Plan: buPROPion (WELLBUTRIN XL) 150 MG 24 hr tablet  ADD (attention deficit disorder) without hyperactivity - doing well on current regimen - Plan: methylphenidate (RITALIN) 10 MG tablet, DISCONTINUED: methylphenidate (RITALIN) 10 MG tablet  Gastroesophageal reflux disease, esophagitis presence not specified - risks/side effects of PPI discussed; encouraged MVI with minerals, prn use, vs H2 blocker - Plan: pantoprazole (PROTONIX) 40 MG tablet  Vitamin D deficiency - take Vitamin D 1000 IU daily; GYN will likely recheck at CPE in January  High triglycerides - diet reviewed (lowfat)--likely to be rechecked by GYN in Jan; to f/u here after   Alternatives to taking pantoprazole daily (or every other day, if that is working well for you) would be ranitidine (zantac) 150 mg twice daily or famotidine (Pepcid) 20mg  twice daily.  These medications are H2 blockers, not proton pump inhibitors, and may have fewer risks as far as the bones go. Continuing to avoid foods which contribute to reflux symptoms, and using the medications as needed prior to these foods/meals can also be helpful. Just be sure to adequately treat your reflux--don't be afraid of the medications and live with daily symptoms, as untreated reflux can also have bad consequences.   Please start taking 1000 IU of Vitamin D3 every day.  Continue your current medications.  Return in 9 months--after your physical with GYN. Return sooner if you are having any problems or concerns.

## 2015-12-13 ENCOUNTER — Other Ambulatory Visit: Payer: Self-pay | Admitting: Family Medicine

## 2015-12-13 ENCOUNTER — Other Ambulatory Visit: Payer: Self-pay | Admitting: *Deleted

## 2015-12-13 MED ORDER — CYCLOBENZAPRINE HCL 10 MG PO TABS: ORAL_TABLET | ORAL | Status: DC

## 2016-04-23 ENCOUNTER — Other Ambulatory Visit: Payer: Self-pay | Admitting: Family Medicine

## 2016-04-24 NOTE — Telephone Encounter (Signed)
Is this okay to refill? 

## 2016-06-04 ENCOUNTER — Other Ambulatory Visit: Payer: Self-pay | Admitting: Obstetrics & Gynecology

## 2016-06-05 ENCOUNTER — Other Ambulatory Visit: Payer: Self-pay | Admitting: Obstetrics & Gynecology

## 2016-06-05 NOTE — Telephone Encounter (Signed)
Medication refill request: Hydrocotisone 2.5% Rectal Cream Last AEX:  05/23/15 SM Next AEX: 06/28/16 SM Last MMG (if hormonal medication request): 10/16/15 [facility] Refill authorized: 05/23/15 #30g 1R. Please advise. Thank you.

## 2016-06-05 NOTE — Telephone Encounter (Signed)
Medication refill request: Medroxyprogesterone Last AEX:  05/23/15 SM Next AEX: 06/28/16 SM      Last MMG (if hormonal medication request): 10/16/15 [facility] Refill authorized: 05/23/15 #90 4R. Please advise. Thank you.   Medication refill request: Estradiol 0.5mg  Last AEX:  05/23/15 SM Next AEX: 06/28/16 SM Last MMG (if hormonal medication request): 10/16/15 [facility] Refill authorized: 05/23/15 #90 4R. Please advise. Thank you.

## 2016-06-23 DIAGNOSIS — R0982 Postnasal drip: Secondary | ICD-10-CM | POA: Diagnosis not present

## 2016-06-23 DIAGNOSIS — J00 Acute nasopharyngitis [common cold]: Secondary | ICD-10-CM | POA: Diagnosis not present

## 2016-06-28 ENCOUNTER — Ambulatory Visit: Payer: Commercial Managed Care - HMO | Admitting: Obstetrics & Gynecology

## 2016-07-18 ENCOUNTER — Telehealth: Payer: Self-pay

## 2016-07-18 ENCOUNTER — Encounter: Payer: Self-pay | Admitting: Obstetrics & Gynecology

## 2016-07-18 ENCOUNTER — Other Ambulatory Visit: Payer: Self-pay | Admitting: Obstetrics & Gynecology

## 2016-07-18 MED ORDER — ESTRADIOL-NORETHINDRONE ACET 0.05-0.25 MG/DAY TD PTTW: 1.0000 | MEDICATED_PATCH | TRANSDERMAL | 3 refills | Status: DC

## 2016-07-18 NOTE — Progress Notes (Signed)
Rx was sent into pharmacy after request from mychart.

## 2016-07-18 NOTE — Telephone Encounter (Signed)
Entered in error

## 2016-07-22 ENCOUNTER — Other Ambulatory Visit: Payer: Self-pay | Admitting: *Deleted

## 2016-07-22 MED ORDER — ESTRADIOL-NORETHINDRONE ACET 0.05-0.25 MG/DAY TD PTTW: 1.0000 | MEDICATED_PATCH | TRANSDERMAL | 3 refills | Status: DC

## 2016-07-22 NOTE — Telephone Encounter (Signed)
Incoming fax from [place] requesting Combipatch rx.  Rx sent to [facility] on 07/18/16. Refer to [place] encounter from 07/18/16.  Rx sent to [facility] today as prescribed by Dr. Sabra Heck on 07/18/16 and requested by patient.   Encounter closed.

## 2016-07-29 ENCOUNTER — Encounter: Payer: Commercial Managed Care - HMO | Admitting: Family Medicine

## 2016-07-29 DIAGNOSIS — Z719 Counseling, unspecified: Secondary | ICD-10-CM | POA: Diagnosis not present

## 2016-09-02 DIAGNOSIS — D225 Melanocytic nevi of trunk: Secondary | ICD-10-CM | POA: Diagnosis not present

## 2016-09-02 DIAGNOSIS — L905 Scar conditions and fibrosis of skin: Secondary | ICD-10-CM | POA: Diagnosis not present

## 2016-09-02 DIAGNOSIS — L821 Other seborrheic keratosis: Secondary | ICD-10-CM | POA: Diagnosis not present

## 2016-09-07 ENCOUNTER — Other Ambulatory Visit: Payer: Self-pay | Admitting: Family Medicine

## 2016-09-07 DIAGNOSIS — K219 Gastro-esophageal reflux disease without esophagitis: Secondary | ICD-10-CM

## 2016-09-15 ENCOUNTER — Other Ambulatory Visit: Payer: Self-pay | Admitting: Obstetrics & Gynecology

## 2016-09-16 NOTE — Telephone Encounter (Signed)
Medication refill request: Zoloft Last AEX:  05/23/15 SM Next AEX: patient had appt scheduled for tomorrow 09/17/16 but cancelled Last MMG (if hormonal medication request): 10/16/15 BIR[facility] 1 negative/density d Refill authorized: 08/01/15 #180 w/3 refills; today please advise. Will call patient to try and reschedule if possible.

## 2016-09-17 ENCOUNTER — Ambulatory Visit: Payer: Commercial Managed Care - HMO | Admitting: Obstetrics & Gynecology

## 2016-09-17 ENCOUNTER — Encounter: Payer: Self-pay | Admitting: Family Medicine

## 2016-09-17 NOTE — Progress Notes (Signed)
Chief Complaint  Patient presents with  . ADHD    nonfasting med check.     Patient presents for med check. She denies any specific concerns or complaints.  Recently saw derm for routine skin check (q6 mos, h/o melanoma), no concerns.  Depression:  She has been taking zoloft 150mg  and Wellbutrin since July 2016.  She denies side effects, moods are good.  Both children are dealing with depression and anxiety, and are also on zoloft.  ADD:  She was started back on Ritalin in 01/2015. She takes 10mg  most workdays. She reports that the medication is effective for 4 hours, so she usually takes it around 11 am. Denies side effects--no tics, insomnia.  Needs refill (is written for BID; she doesn't need to take it that way, so rx lasts a while; last filled in 10/2015?).  GERD:  Changed to pantoprazole in 01/2015. She was concerned about possible longterm effects, so we had discussed ongoing PPI (and supplements) vs trial of H2 blockers.  Currently has been taking it about 4 days/week (with large meals, spicy foods), using Pepcid sometimes as well.  Doesn't have problems on the days she doesn't take it. Denies dysphagia.  Vitamin D was noted to be low at 26 in 05/2015 (ordered by GYN). She has been taking a daily MVI for women, with vitamin D in it. appt with GYN got rescheduled to later this month, so hasn't had labs checked again yet.  Elevated TG noted on 05/2015 labs: Lab Results  Component Value Date   CHOL 209 (H) 05/23/2015   HDL 61 05/23/2015   LDLCALC 106 05/23/2015   TRIG 209 (H) 05/23/2015   CHOLHDL 3.4 05/23/2015   She has been trying hard to follow a lowfat diet. Frustrated she hasn't been able to lose the weight she gained (over a year ago).  Also requesting refill of flexeril.  Uses prn for back pain, not taking recently because she only has a few left.  Uses at night, when having back pain, sometimes every other day.  Requesting refill.  Exercise--walks 20 minutes daily at  work, gym 3x/week.  PMH, PSH, SH reviewed  Outpatient Encounter Prescriptions as of 09/18/2016  Medication Sig Note  . buPROPion (WELLBUTRIN XL) 150 MG 24 hr tablet Take 1 tablet (150 mg total) by mouth daily.   Marland Kitchen doxylamine, Sleep, (UNISOM) 25 MG tablet Take 25 mg by mouth at bedtime as needed.   Marland Kitchen estradiol-norethindrone (COMBIPATCH) 0.05-0.25 MG/DAY Place 1 patch onto the skin 2 (two) times a week.   . methylphenidate (RITALIN) 10 MG tablet 1 tablet by mouth twice daily as directed   . Multiple Vitamins-Minerals (MULTIVITAMIN ADULT PO) Take 1 tablet by mouth daily.   . pantoprazole (PROTONIX) 40 MG tablet Take 1 tablet (40 mg total) by mouth daily.   . sertraline (ZOLOFT) 100 MG tablet TAKE 1 AND 1/2 TO 2 TABLETS DAILY   . [DISCONTINUED] buPROPion (WELLBUTRIN XL) 150 MG 24 hr tablet Take 1 tablet (150 mg total) by mouth daily.   . [DISCONTINUED] methylphenidate (RITALIN) 10 MG tablet 1 tablet by mouth twice daily as directed   . [DISCONTINUED] pantoprazole (PROTONIX) 40 MG tablet TAKE ONE TABLET BY MOUTH DAILY   . [DISCONTINUED] sertraline (ZOLOFT) 100 MG tablet TAKE 1 AND 1/2 TO 2 TABLETS DAILY 09/18/2016: Taking 1.5 tabs   . calcium carbonate (OS-CAL) 600 MG TABS tablet Take 600 mg by mouth daily with breakfast.   . cyclobenzaprine (FLEXERIL) 10 MG tablet TAKE ONE TABLET  BY MOUTH THREE TIMES A DAY AS NEEDED FOR MUSCLE SPASMS   . PROCTO-MED HC 2.5 % rectal cream PLACE 1 APPLICATION RECTALLY 2 (TWO) TIMES DAILY. (Patient not taking: Reported on 09/18/2016)   . [DISCONTINUED] cyclobenzaprine (FLEXERIL) 10 MG tablet TAKE ONE TABLET BY MOUTH THREE TIMES A DAY AS NEEDED FOR MUSCLE SPASMS (Patient not taking: Reported on 09/18/2016) 09/18/2016: Uses prn  . [DISCONTINUED] naproxen (NAPROSYN) 500 MG tablet Take 1 tablet (500 mg total) by mouth 2 (two) times daily with a meal. Use for a week at a time, then as needed   . [DISCONTINUED] sertraline (ZOLOFT) 100 MG tablet TAKE 1 AND 1/2 TO 2 TABLETS DAILY     No facility-administered encounter medications on file as of 09/18/2016.    Allergies  Allergen Reactions  . Phenergan [Promethazine Hcl] Other (See Comments)    Loss consciousness    ROS: +back pain intermittently. No numbness, tingling, sciatica or leg weakness.  Denies fever, chills, sinus pain.  Claritin works for her allergies. Denies dysphagia.  Denies nausea, vomiting, abdominal pain, bowel changes.  No skin/hair/mood changes. Moods are good. No bleeding, bruising, rash.  See HPI.   PHYSICAL EXAM:  BP 122/82 (BP Location: Left Arm, Patient Position: Sitting, Cuff Size: Normal)   Pulse 72   Ht 5' 7.75" (1.721 m)   Wt 162 lb 12.8 oz (73.8 kg)   LMP 09/01/2012   BMI 24.94 kg/m   Wt Readings from Last 3 Encounters:  09/18/16 162 lb 12.8 oz (73.8 kg)  10/25/15 160 lb 12.8 oz (72.9 kg)  09/19/15 163 lb (73.9 kg)   Well appearing, pleasant female in no distress HEENT: conjunctiva and sclera are clear. OP clear Neck: no lymphadenopathy, thyromegaly or carotid bruit Heart: regular rate and rhythm without murmur Lungs: clear bilaterally Back: no spinal or CVA tenderness.  Area of discomfort is thoracic, just below bra-line, feels slightly tighter on the left paraspinous muscles than right; nontender now. Abdomen: soft, nontender, no organomegaly or mass Extremities: no edema, 2+ pulses Skin: normal turgor, no rash Psych: normal mood, affect, hygiene and grooming Neuro: alert and oriented, cranial nerves intact. Normal strength, gait    ASSESSMENT/PLAN:  ADD (attention deficit disorder) without hyperactivity - doing well on current regimen--based on refills, doesn't use it very often, effective when she does - Plan: methylphenidate (RITALIN) 10 MG tablet  High triglycerides - continue lowfat diet (reviewed). Return for fasting labs - Plan: Lipid panel  Vitamin D deficiency - due for recheck since taking MVI regularly - Plan: VITAMIN D 25 Hydroxy (Vit-D Deficiency,  Fractures)  Depression, major, in remission ([place]) - well controlled on zoloft and wellbutrin, continue - Plan: TSH, sertraline (ZOLOFT) 100 MG tablet, buPROPion (WELLBUTRIN XL) 150 MG 24 hr tablet  Other fatigue - Plan: CBC with Differential/Platelet, VITAMIN D 25 Hydroxy (Vit-D Deficiency, Fractures), Comprehensive metabolic panel, TSH  Medication monitoring encounter - Plan: CBC with Differential/Platelet, VITAMIN D 25 Hydroxy (Vit-D Deficiency, Fractures), Comprehensive metabolic panel, Magnesium  Gastroesophageal reflux disease, esophagitis presence not specified - controlled on current regimen/diet - Plan: pantoprazole (PROTONIX) 40 MG tablet  Low back pain without sciatica, unspecified back pain laterality, unspecified chronicity - Plan: cyclobenzaprine (FLEXERIL) 10 MG tablet  Return for fasting labs Forward results to Dr. Sabra Heck (GYN)--she has an upcoming appointment.  Shingrix recommended, risks/side effects reviewed.  To check with her insurance.

## 2016-09-17 NOTE — Telephone Encounter (Signed)
Patient has been scheduled for 09/30/16 at 1:30 for AEX

## 2016-09-18 ENCOUNTER — Ambulatory Visit (INDEPENDENT_AMBULATORY_CARE_PROVIDER_SITE_OTHER): Payer: 59 | Admitting: Family Medicine

## 2016-09-18 ENCOUNTER — Encounter: Payer: Self-pay | Admitting: Family Medicine

## 2016-09-18 VITALS — BP 122/82 | HR 72 | Ht 67.75 in | Wt 162.8 lb

## 2016-09-18 DIAGNOSIS — F325 Major depressive disorder, single episode, in full remission: Secondary | ICD-10-CM | POA: Diagnosis not present

## 2016-09-18 DIAGNOSIS — E781 Pure hyperglyceridemia: Secondary | ICD-10-CM

## 2016-09-18 DIAGNOSIS — K219 Gastro-esophageal reflux disease without esophagitis: Secondary | ICD-10-CM | POA: Diagnosis not present

## 2016-09-18 DIAGNOSIS — Z5181 Encounter for therapeutic drug level monitoring: Secondary | ICD-10-CM | POA: Diagnosis not present

## 2016-09-18 DIAGNOSIS — F988 Other specified behavioral and emotional disorders with onset usually occurring in childhood and adolescence: Secondary | ICD-10-CM

## 2016-09-18 DIAGNOSIS — R5383 Other fatigue: Secondary | ICD-10-CM

## 2016-09-18 DIAGNOSIS — E559 Vitamin D deficiency, unspecified: Secondary | ICD-10-CM | POA: Diagnosis not present

## 2016-09-18 DIAGNOSIS — M545 Low back pain, unspecified: Secondary | ICD-10-CM

## 2016-09-18 MED ORDER — BUPROPION HCL ER (XL) 150 MG PO TB24: 150.0000 mg | ORAL_TABLET | Freq: Every day | ORAL | 3 refills | Status: DC

## 2016-09-18 MED ORDER — PANTOPRAZOLE SODIUM 40 MG PO TBEC: 40.0000 mg | DELAYED_RELEASE_TABLET | Freq: Every day | ORAL | 3 refills | Status: DC

## 2016-09-18 MED ORDER — SERTRALINE HCL 100 MG PO TABS: ORAL_TABLET | ORAL | 3 refills | Status: DC

## 2016-09-18 MED ORDER — METHYLPHENIDATE HCL 10 MG PO TABS: ORAL_TABLET | ORAL | 0 refills | Status: DC

## 2016-09-18 MED ORDER — CYCLOBENZAPRINE HCL 10 MG PO TABS: ORAL_TABLET | ORAL | 0 refills | Status: DC

## 2016-09-18 NOTE — Patient Instructions (Signed)
I recommend getting the new shingles vaccine (Shingrix). You will need to check with your insurance to see if it is covered,.  It is a series of 2 injections, spaced 2 months apart.  Return for fasting labs. I will send these to your gynecologist. Continue your current medications the way you are taking them.   Interstitial Cystitis Interstitial cystitis is a condition that causes inflammation of the bladder. The bladder is a hollow organ in the lower part of your abdomen. It stores urine after the urine is made by your kidneys. With interstitial cystitis, you may have pain in the bladder area. You may also have a frequent and urgent need to urinate. The severity of interstitial cystitis can vary from person to person. You may have flare-ups of the condition, and then it may go away for a while. For many people who have this condition, it becomes a long-term problem. What are the causes? The cause of this condition is not known. What increases the risk? This condition is more likely to develop in women. What are the signs or symptoms? Symptoms of interstitial cystitis vary, and they can change over time. Symptoms may include:  Discomfort or pain in the bladder area. This can range from mild to severe. The pain may change in intensity as the bladder fills with urine or as it empties.  Pelvic pain.  An urgent need to urinate.  Frequent urination.  Pain during sexual intercourse.  Pinpoint bleeding on the bladder wall. For women, the symptoms often get worse during menstruation. How is this diagnosed? This condition is diagnosed by evaluating your symptoms and ruling out other causes. A physical exam will be done. Various tests may be done to rule out other conditions. Common tests include:  Urine tests.  Cystoscopy. In this test, a tool that is like a very thin telescope is used to look into your bladder.  Biopsy. This involves taking a sample of tissue from the bladder wall to be  examined under a microscope. How is this treated? There is no cure for interstitial cystitis, but treatment methods are available to control your symptoms. Work closely with your health care provider to find the treatments that will be most effective for you. Treatment options may include:  Medicines to relieve pain and to help reduce the number of times that you feel the need to urinate.  Bladder training. This involves learning ways to control when you urinate, such as:  Urinating at scheduled times.  Training yourself to delay urination.  Doing exercises (Kegel exercises) to strengthen the muscles that control urine flow.  Lifestyle changes, such as changing your diet or taking steps to control stress.  Use of a device that provides electrical stimulation in order to reduce pain.  A procedure that stretches your bladder by filling it with air or fluid.  Surgery. This is rare. It is only done for extreme cases if other treatments do not help. Follow these instructions at home:  Take medicines only as directed by your health care provider.  Use bladder training techniques as directed.  Keep a bladder diary to find out which foods, liquids, or activities make your symptoms worse.  Use your bladder diary to schedule bathroom trips. If you are away from home, plan to be near a bathroom at each of your scheduled times.  Make sure you urinate just before you leave the house and just before you go to bed.  Do Kegel exercises as directed by your health care  provider.  Do not drink alcohol.  Do not use any tobacco products, including cigarettes, chewing tobacco, or electronic cigarettes. If you need help quitting, ask your health care provider.  Make dietary changes as directed by your health care provider. You may need to avoid spicy foods and foods that contain a high amount of potassium.  Limit your drinking of beverages that stimulate urination. These include soda, coffee, and  tea.  Keep all follow-up visits as directed by your health care provider. This is important. Contact a health care provider if:  Your symptoms do not get better after treatment.  Your pain and discomfort are getting worse.  You have more frequent urges to urinate.  You have a fever. Get help right away if:  You are not able to control your bladder at all. This information is not intended to replace advice given to you by your health care provider. Make sure you discuss any questions you have with your health care provider. Document Released: 01/19/2004 Document Revised: 10/26/2015 Document Reviewed: 01/25/2014 Elsevier Interactive Patient Education  2017 [employer].

## 2016-09-19 ENCOUNTER — Other Ambulatory Visit: Payer: 59

## 2016-09-19 DIAGNOSIS — F325 Major depressive disorder, single episode, in full remission: Secondary | ICD-10-CM

## 2016-09-19 DIAGNOSIS — E559 Vitamin D deficiency, unspecified: Secondary | ICD-10-CM

## 2016-09-19 DIAGNOSIS — R5383 Other fatigue: Secondary | ICD-10-CM

## 2016-09-19 DIAGNOSIS — E781 Pure hyperglyceridemia: Secondary | ICD-10-CM

## 2016-09-19 DIAGNOSIS — Z5181 Encounter for therapeutic drug level monitoring: Secondary | ICD-10-CM

## 2016-09-19 LAB — LIPID PANEL
Cholesterol: 179 mg/dL (ref <200)
HDL: 55 mg/dL (ref >50)
LDL CALC: 100 mg/dL — AB (ref <100)
Total CHOL/HDL Ratio: 3.3 Ratio (ref <5.0)
Triglycerides: 119 mg/dL (ref <150)
VLDL: 24 mg/dL (ref <30)

## 2016-09-19 LAB — CBC WITH DIFFERENTIAL/PLATELET
BASOS PCT: 1 %
Basophils Absolute: 44 cells/uL (ref 0–200)
EOS ABS: 88 {cells}/uL (ref 15–500)
Eosinophils Relative: 2 %
HCT: 40.1 % (ref 35.0–45.0)
Hemoglobin: 13.3 g/dL (ref 11.7–15.5)
LYMPHS PCT: 38 %
Lymphs Abs: 1672 cells/uL (ref 850–3900)
MCH: 30.7 pg (ref 27.0–33.0)
MCHC: 33.2 g/dL (ref 32.0–36.0)
MCV: 92.6 fL (ref 80.0–100.0)
MONOS PCT: 7 %
MPV: 9.3 fL (ref 7.5–12.5)
Monocytes Absolute: 308 cells/uL (ref 200–950)
Neutro Abs: 2288 cells/uL (ref 1500–7800)
Neutrophils Relative %: 52 %
PLATELETS: 234 10*3/uL (ref 140–400)
RBC: 4.33 MIL/uL (ref 3.80–5.10)
RDW: 12.6 % (ref 11.0–15.0)
WBC: 4.4 10*3/uL (ref 4.0–10.5)

## 2016-09-19 LAB — COMPREHENSIVE METABOLIC PANEL
ALT: 10 U/L (ref 6–29)
AST: 15 U/L (ref 10–35)
Albumin: 4 g/dL (ref 3.6–5.1)
Alkaline Phosphatase: 56 U/L (ref 33–130)
BUN: 12 mg/dL (ref 7–25)
CALCIUM: 9.1 mg/dL (ref 8.6–10.4)
CO2: 22 mmol/L (ref 20–31)
Chloride: 108 mmol/L (ref 98–110)
Creat: 0.81 mg/dL (ref 0.50–1.05)
GLUCOSE: 100 mg/dL — AB (ref 65–99)
POTASSIUM: 4.1 mmol/L (ref 3.5–5.3)
Sodium: 139 mmol/L (ref 135–146)
Total Bilirubin: 0.6 mg/dL (ref 0.2–1.2)
Total Protein: 6.2 g/dL (ref 6.1–8.1)

## 2016-09-19 LAB — TSH: TSH: 0.95 mIU/L

## 2016-09-20 LAB — MAGNESIUM: Magnesium: 2 mg/dL (ref 1.5–2.5)

## 2016-09-20 LAB — VITAMIN D 25 HYDROXY (VIT D DEFICIENCY, FRACTURES): Vit D, 25-Hydroxy: 22 ng/mL — ABNORMAL LOW (ref 30–100)

## 2016-09-20 MED ORDER — VITAMIN D (ERGOCALCIFEROL) 1.25 MG (50000 UNIT) PO CAPS: 50000.0000 [IU] | ORAL_CAPSULE | ORAL | 0 refills | Status: DC

## 2016-09-26 ENCOUNTER — Encounter: Payer: Self-pay | Admitting: Family Medicine

## 2016-09-30 ENCOUNTER — Encounter: Payer: Self-pay | Admitting: Obstetrics & Gynecology

## 2016-09-30 ENCOUNTER — Ambulatory Visit (INDEPENDENT_AMBULATORY_CARE_PROVIDER_SITE_OTHER): Payer: 59 | Admitting: Obstetrics & Gynecology

## 2016-09-30 VITALS — BP 110/78 | HR 68 | Resp 12 | Ht 68.0 in | Wt 162.2 lb

## 2016-09-30 DIAGNOSIS — Z01419 Encounter for gynecological examination (general) (routine) without abnormal findings: Secondary | ICD-10-CM | POA: Diagnosis not present

## 2016-09-30 MED ORDER — ESTRADIOL-NORETHINDRONE ACET 0.05-0.25 MG/DAY TD PTTW: 1.0000 | MEDICATED_PATCH | TRANSDERMAL | 3 refills | Status: DC

## 2016-09-30 NOTE — Progress Notes (Signed)
56 y.o. Traci Welch MarriedCaucasianF here for annual exam.  Doing well.  Would like to get the new shingles vaccination.  Has checked with insurance.    Denies vaginal bleeding.  Patient's last menstrual period was 09/01/2012.          Sexually active: Yes.    The current method of family planning is vasectomy.    Exercising: Yes.    Walking, gym Smoker:  no  Health Maintenance: Pap: 05/23/15 Neg: Neg HR HPV; 02/19/12 Neg History of abnormal Pap:  no MMG:  10/16/15 BIRADS1, Density D, [facility] Colonoscopy:  04/22/12 Polyps - repeat 5 years  BMD:   Never TDaP: 05/23/15 Hep C: neg 12/16 Screening Labs: PCP   reports that she has never smoked. She has never used smokeless tobacco. She reports that she drinks about 2.4 oz of alcohol per week . She reports that she does not use drugs.  Past Medical History:  Diagnosis Date  . Hemorrhoids, internal 07/05/15   banding;Dr.Medoff  . Low back pain    chronic "back aches"  . Melanoma ([place]) end of 2014   left leg  . Postmenopausal   . Postpartum depression   . Seasonal allergies   . Yeast infection    history     Past Surgical History:  Procedure Laterality Date  . CESAREAN SECTION    . MELANOMA EXCISION Left    Leg  . NOSE SURGERY    . WISDOM TOOTH EXTRACTION      Current Outpatient Prescriptions  Medication Sig Dispense Refill  . buPROPion (WELLBUTRIN XL) 150 MG 24 hr tablet Take 1 tablet (150 mg total) by mouth daily. 90 tablet 3  . calcium carbonate (OS-CAL) 600 MG TABS tablet Take 600 mg by mouth daily with breakfast.    . cyclobenzaprine (FLEXERIL) 10 MG tablet TAKE ONE TABLET BY MOUTH THREE TIMES A DAY AS NEEDED FOR MUSCLE SPASMS 30 tablet 0  . doxylamine, Sleep, (UNISOM) 25 MG tablet Take 25 mg by mouth at bedtime as needed.    Marland Kitchen estradiol-norethindrone (COMBIPATCH) 0.05-0.25 MG/DAY Place 1 patch onto the skin 2 (two) times a week. 8 patch 3  . methylphenidate (RITALIN) 10 MG tablet 1 tablet by mouth twice daily as  directed 60 tablet 0  . Multiple Vitamins-Minerals (MULTIVITAMIN ADULT PO) Take 1 tablet by mouth daily.    . pantoprazole (PROTONIX) 40 MG tablet Take 1 tablet (40 mg total) by mouth daily. 90 tablet 3  . PROCTO-MED HC 2.5 % rectal cream PLACE 1 APPLICATION RECTALLY 2 (TWO) TIMES DAILY. 30 g 1  . sertraline (ZOLOFT) 100 MG tablet TAKE 1 AND 1/2 TO 2 TABLETS DAILY 135 tablet 3  . Vitamin D, Ergocalciferol, (DRISDOL) 50000 units CAPS capsule Take 1 capsule (50,000 Units total) by mouth every 7 (seven) days. 12 capsule 0   No current facility-administered medications for this visit.     Family History  Problem Relation Age of Onset  . Emphysema Mother   . Hypertension Mother   . Migraines Mother   . Throat cancer Father     nonsmoker, h/o reflux  . Testicular cancer Father   . Colitis Sister   . Depression Sister   . Depression Daughter   . Depression Son   . Anxiety disorder Son   . Diabetes Neg Hx     ROS:  Pertinent items are noted in HPI.  Otherwise, a comprehensive ROS was negative.  Exam:   BP 110/78 (BP Location: Right Arm, Patient Position:  Sitting, Cuff Size: Normal)   Pulse 68   Resp 12   Ht 5\' 8"  (1.727 m)   Wt 162 lb 3.2 oz (73.6 kg)   LMP 09/01/2012   BMI 24.66 kg/m   :   Height: 5\' 8"  (172.7 cm)  Ht Readings from Last 3 Encounters:  09/30/16 5\' 8"  (1.727 m)  09/18/16 5' 7.75" (1.721 m)  10/25/15 5\' 8"  (1.727 m)    General appearance: alert, cooperative and appears stated age Head: Normocephalic, without obvious abnormality, atraumatic Neck: no adenopathy, supple, symmetrical, trachea midline and thyroid normal to inspection and palpation Lungs: clear to auscultation bilaterally Breasts: normal appearance, no masses or tenderness Heart: regular rate and rhythm Abdomen: soft, non-tender; bowel sounds normal; no masses,  no organomegaly Extremities: extremities normal, atraumatic, no cyanosis or edema Skin: Skin color, texture, turgor normal. No rashes or  lesions Lymph nodes: Cervical, supraclavicular, and axillary nodes normal. No abnormal inguinal nodes palpated Neurologic: Grossly normal   Pelvic: External genitalia:  no lesions              Urethra:  normal appearing urethra with no masses, tenderness or lesions              Bartholins and Skenes: normal                 Vagina: normal appearing vagina with normal color and discharge, no lesions              Cervix: no lesions              Pap taken: No. Bimanual Exam:  Uterus:  normal size, contour, position, consistency, mobility, non-tender              Adnexa: normal adnexa and no mass, fullness, tenderness               Rectovaginal: Confirms               Anus:  normal sphincter tone, no lesions, external hemorrhoids Chaperone was present for exam.  A:  Well Woman with normal exam On HRT, desirous of continuing Hemorrhoids H/O depression GERD H/o melanoma.  Followed by Dr. Allyson Sabal every six months.  P:   Mammogram guidelines reviewed.  Highly recommended pt having 3D MMG done this year pap smear not indicated  Lab work UTD Order for shingles vaccine given RF combipatch 50/250.  #24/3RF Return annually or prn

## 2016-10-06 ENCOUNTER — Other Ambulatory Visit: Payer: Self-pay | Admitting: Obstetrics & Gynecology

## 2016-10-07 NOTE — Telephone Encounter (Signed)
Request for 90 day supply to be sent to mail in pharmacy  Medication refill request: Combipatch  Last AEX:  09-30-16  Next AEX: 11-24-17  Last MMG (if hormonal medication request): 10-16-15 WNL  Refill authorized: please advise

## 2016-10-17 ENCOUNTER — Other Ambulatory Visit: Payer: Self-pay | Admitting: Family Medicine

## 2016-10-21 ENCOUNTER — Other Ambulatory Visit: Payer: Self-pay | Admitting: Family Medicine

## 2016-10-21 DIAGNOSIS — Z1231 Encounter for screening mammogram for malignant neoplasm of breast: Secondary | ICD-10-CM

## 2016-10-22 DIAGNOSIS — Z23 Encounter for immunization: Secondary | ICD-10-CM | POA: Diagnosis not present

## 2016-10-24 ENCOUNTER — Encounter: Payer: Self-pay | Admitting: *Deleted

## 2017-01-01 ENCOUNTER — Ambulatory Visit
Admission: RE | Admit: 2017-01-01 | Discharge: 2017-01-01 | Disposition: A | Discharge status: Home / Self Care | Payer: 59 | Source: Ambulatory Visit | Attending: Family Medicine | Admitting: Family Medicine

## 2017-01-01 DIAGNOSIS — Z1231 Encounter for screening mammogram for malignant neoplasm of breast: Secondary | ICD-10-CM

## 2017-01-22 DIAGNOSIS — Z23 Encounter for immunization: Secondary | ICD-10-CM | POA: Diagnosis not present

## 2017-02-17 ENCOUNTER — Other Ambulatory Visit: Payer: Self-pay | Admitting: Family Medicine

## 2017-02-17 DIAGNOSIS — M545 Low back pain, unspecified: Secondary | ICD-10-CM

## 2017-02-17 NOTE — Telephone Encounter (Signed)
Is this okay to refill? 

## 2017-03-06 DIAGNOSIS — L309 Dermatitis, unspecified: Secondary | ICD-10-CM | POA: Diagnosis not present

## 2017-03-06 DIAGNOSIS — D1801 Hemangioma of skin and subcutaneous tissue: Secondary | ICD-10-CM | POA: Diagnosis not present

## 2017-03-06 DIAGNOSIS — D225 Melanocytic nevi of trunk: Secondary | ICD-10-CM | POA: Diagnosis not present

## 2017-04-08 DIAGNOSIS — Z1211 Encounter for screening for malignant neoplasm of colon: Secondary | ICD-10-CM | POA: Diagnosis not present

## 2017-04-14 ENCOUNTER — Encounter: Payer: Self-pay | Admitting: Family Medicine

## 2017-05-07 DIAGNOSIS — D125 Benign neoplasm of sigmoid colon: Secondary | ICD-10-CM | POA: Diagnosis not present

## 2017-05-07 DIAGNOSIS — K635 Polyp of colon: Secondary | ICD-10-CM | POA: Diagnosis not present

## 2017-05-07 DIAGNOSIS — Z1211 Encounter for screening for malignant neoplasm of colon: Secondary | ICD-10-CM | POA: Diagnosis not present

## 2017-05-07 LAB — HM COLONOSCOPY

## 2017-08-19 ENCOUNTER — Encounter: Payer: Self-pay | Admitting: Family Medicine

## 2017-09-03 ENCOUNTER — Telehealth: Payer: Self-pay | Admitting: *Deleted

## 2017-09-03 ENCOUNTER — Encounter: Payer: Self-pay | Admitting: Obstetrics & Gynecology

## 2017-09-03 NOTE — Telephone Encounter (Signed)
-----   Message from [place], Generic sent at 09/03/2017 10:51 AM EDT -----    Hi Dr. Sabra Heck. Could you please refill my PROCTO-MED HC 2.5 % cream? I think my refills expired. My pharmacy is the [facility] on [address]. [address]. Thanks so much

## 2017-09-03 NOTE — Telephone Encounter (Signed)
Spoke with patient and she is requesting refill on Procto-Med HC 2.5% for her hemorrhoids. Advised pt.Dr.Miller not in office today and inquired if needed today? She states this is nothing new for her, just routine hemorrhoid problems(irritation and slight bleeding occ). Will route to Dr.Miller

## 2017-09-05 MED ORDER — HYDROCORTISONE 2.5 % RE CREA: 1.0000 "application " | TOPICAL_CREAM | Freq: Two times a day (BID) | RECTAL | 1 refills | Status: DC

## 2017-09-05 NOTE — Telephone Encounter (Signed)
RX completed.  Ok to close encounter.

## 2017-09-05 NOTE — Telephone Encounter (Signed)
Patient notified- Rx sent to pharmacy. 

## 2017-10-05 ENCOUNTER — Other Ambulatory Visit: Payer: Self-pay | Admitting: Obstetrics & Gynecology

## 2017-10-06 DIAGNOSIS — L814 Other melanin hyperpigmentation: Secondary | ICD-10-CM | POA: Diagnosis not present

## 2017-10-06 DIAGNOSIS — D225 Melanocytic nevi of trunk: Secondary | ICD-10-CM | POA: Diagnosis not present

## 2017-10-06 DIAGNOSIS — L821 Other seborrheic keratosis: Secondary | ICD-10-CM | POA: Diagnosis not present

## 2017-10-06 NOTE — Telephone Encounter (Signed)
Medication refill request: combipatch  Last AEX:  09/30/16 SM  Next AEX: 11/24/17  Last MMG (if hormonal medication request): 01/01/17 BIRADS 1 negative  Refill authorized: 10/07/16 24 patches, 4RF. Today, please advise.

## 2017-10-09 ENCOUNTER — Other Ambulatory Visit: Payer: Self-pay | Admitting: Family Medicine

## 2017-10-09 DIAGNOSIS — F325 Major depressive disorder, single episode, in full remission: Secondary | ICD-10-CM

## 2017-10-10 NOTE — Telephone Encounter (Signed)
Pt has scheduled a med check for the end of month. I will refill now for 30 days

## 2017-10-21 NOTE — Progress Notes (Signed)
Chief Complaint  Patient presents with  . Depression    nonfasting med check. She did get the second Shingrix at a pharmacy (unsure of where) but did get it in the recommended time frame. Unsure of where and when. No concerns.     Patient presents for med check. She denies any specific concerns or complaints. Not seen in this office since 09/2016.  Called with sciatica complaints 08/2017 after weeding. Resolved.  Sees derm for routine skin check (now yearly; h/o melanoma). Scheduled for routine GYN exam with Dr. Sabra Heck 11/24/17 She had colonoscopy 05/2017, hyperplastic polyps. (Dr. Collene Mares).  Depression: She had been taking zoloft '150mg'$  and Wellbutrin since July 2016. Admits that she hasn't been taking the Wellbutrin in about a month (just if needed for a bad day). She denies side effects, moods are good.  Both children are dealing with depression and anxiety, and are also on zoloft. Both are now transgender (son recently, while at [employer]).  She hasn't noticed any difference in moods yet since not taking Wellbutrin.  ADD: She was started back on Ritalin in 01/2015. She takes '10mg'$  most workdays. She reports that the medication is effective for 4 hours, so she usually takes it around 11 am, takes just once daily. Denies side effects--no tics, insomnia.   Last written for #60 in 09/2016. Has been out for about 3 months.  She feels more focused when she takes it. Has busier seasons, usually in the summer, lasting through Christmas.  Asking for refill.  GERD: Changed to pantoprazole in 01/2015. She was concerned about possible longterm effects, so we had discussed ongoing PPI (and supplements) vs trial of H2 blockers. Uses PPI only 3x/week, as needed, as she is afraid to take it daily (didn't try H2 blocker). Uses it with larger meals.  Doesn't have problems on the days she doesn't take it.  Denies dysphagia.  Vitamin D deficiency:  Last level was low at 22 in 09/2016. She was treated with 12 weeks of rx  Vitamin D, then advised to start 1000 IU of D3 in addition to her MVI (she had been taking daily MVI prior to that test).  She is currently taking 1000 IU + MVI daily.  Elevated TG noted on 05/2015 labs (TG 209).  She tries to follow a lowfat diet. Repeat last year was normal: Lab Results  Component Value Date   CHOL 179 09/19/2016   HDL 55 09/19/2016   LDLCALC 100 (H) 09/19/2016   TRIG 119 09/19/2016   CHOLHDL 3.3 09/19/2016   She thinks she may need lipids done for insurance purposes/form.  Not fasting today.  Back pain: flares periodically, uses flexeril prn. Last filled #30 in September 2018. Requesting refill today.   Immunization History  Administered Date(s) Administered  . Influenza,inj,Quad PF,6+ Mos 01/19/2015  . Tdap 05/23/2015  . Zoster Recombinat (Shingrix) 10/22/2016   She reports she also got 2nd Shingrix at pharmacy (thinks [facility])  PMH, [place], [place] reviewed  Outpatient Encounter Medications as of 10/23/2017  Medication Sig Note  . calcium carbonate (OS-CAL) 600 MG TABS tablet Take 600 mg by mouth daily with breakfast.   . cholecalciferol (VITAMIN D) 1000 units tablet Take 1,000 Units by mouth daily.   . COMBIPATCH 0.05-0.25 MG/DAY APPLY 1 PATCH TWICE WEEKLY   . doxylamine, Sleep, (UNISOM) 25 MG tablet Take 25 mg by mouth at bedtime as needed.   . hydrocortisone (PROCTO-MED HC) 2.5 % rectal cream Place 1 application rectally 2 (two) times daily.   Marland Kitchen  methylphenidate (RITALIN) 10 MG tablet 1 tablet by mouth twice daily as directed   . Multiple Vitamins-Minerals (MULTIVITAMIN ADULT PO) Take 1 tablet by mouth daily.   . pantoprazole (PROTONIX) 40 MG tablet Take 1 tablet (40 mg total) by mouth daily.   . sertraline (ZOLOFT) 100 MG tablet TAKE ONE AND ONE-HALF (1  1/2) TO 2 TABLETS BY MOUTH DAILY   . [DISCONTINUED] methylphenidate (RITALIN) 10 MG tablet 1 tablet by mouth twice daily as directed   . [DISCONTINUED] pantoprazole (PROTONIX) 40 MG tablet Take 1 tablet (40 mg  total) by mouth daily. 10/23/2017: Uses it just prn, about 3x/week  . buPROPion (WELLBUTRIN XL) 150 MG 24 hr tablet Take 1 tablet (150 mg total) by mouth daily. (Patient not taking: Reported on 10/23/2017) 10/23/2017: Uses sporadically  . cyclobenzaprine (FLEXERIL) 10 MG tablet TAKE ONE TABLET BY MOUTH THREE TIMES A DAY AS NEEDED FOR MUSCLE SPASMS   . [DISCONTINUED] cyclobenzaprine (FLEXERIL) 10 MG tablet TAKE ONE TABLET BY MOUTH THREE TIMES A DAY AS NEEDED FOR MUSCLE SPASMS (Patient not taking: Reported on 10/23/2017)   . [DISCONTINUED] Vitamin D, Ergocalciferol, (DRISDOL) 50000 units CAPS capsule Take 1 capsule (50,000 Units total) by mouth every 7 (seven) days.    No facility-administered encounter medications on file as of 10/23/2017.    Allergies  Allergen Reactions  . Phenergan [Promethazine Hcl] Other (See Comments)    Loss consciousness    ROS:  No fever, chills, URI symptoms, cough, shortness of breath, headaches, dizziness, chest pain. Moods remain good/controlled.  No heartburn, dysphagia, bowel changes or other concerns. No skin complaints/concerns.   PHYSICAL EXAM:  BP 138/72   Pulse 84   Ht '5\' 8"'$  (1.727 m)   Wt 160 lb (72.6 kg)   LMP 09/01/2012   BMI 24.33 kg/m    Wt Readings from Last 3 Encounters:  09/30/16 162 lb 3.2 oz (73.6 kg)  09/18/16 162 lb 12.8 oz (73.8 kg)  10/25/15 160 lb 12.8 oz (72.9 kg)    Well appearing, pleasant female in no distress HEENT: conjunctiva and sclera are clear. OP clear Neck: no lymphadenopathy, thyromegaly or carotid bruit Heart: regular rate and rhythm without murmur Lungs: clear bilaterally Back: no spinal or CVA tenderness.  Abdomen: soft, nontender, no organomegaly or mass Extremities: no edema, 2+ pulses Skin: normal turgor, no rash Psych: normal mood, affect, hygiene and grooming Neuro: alert and oriented, cranial nerves intact. Normal gait   ASSESSMENT/PLAN:  Vitamin D deficiency - compliant with D and MVI supplements,  due for recheck - Plan: VITAMIN D 25 Hydroxy (Vit-D Deficiency, Fractures)  ADD (attention deficit disorder) without hyperactivity - to use during busier season when needs to be more focused - Plan: methylphenidate (RITALIN) 10 MG tablet  Depression, major, in remission ([place]) - stopped Wellbutrin 1 mo ago--to be on the lookout fo recurrent symptoms over the next 4-6 weeks, then restart. o/w to cont Zoloft only - Plan: TSH  Medication monitoring encounter - Plan: Comprehensive metabolic panel, CBC with Differential/Platelet, VITAMIN D 25 Hydroxy (Vit-D Deficiency, Fractures)  Hypertriglyceridemia - Plan: Lipid panel  ADD (attention deficit disorder) without hyperactivity - doing well on current regimen--based on refills, doesn't use it very often, effective when she does - Plan: methylphenidate (RITALIN) 10 MG tablet  Low back pain without sciatica, unspecified back pain laterality, unspecified chronicity - Plan: cyclobenzaprine (FLEXERIL) 10 MG tablet  Gastroesophageal reflux disease, esophagitis presence not specified - doing well on prn PPI  - Plan: pantoprazole (PROTONIX) 40 MG  tablet  Pt to try and check with pharmacy to get [place] date of 2nd Shingrix so we can document in her chart.  Given lab slip to get fasting labs done at [facility] CBC, c-met, TSH, Vit D Lipids--needs for forms  F/u 1 year, sooner prn

## 2017-10-23 ENCOUNTER — Encounter: Payer: Self-pay | Admitting: Family Medicine

## 2017-10-23 ENCOUNTER — Ambulatory Visit: Payer: 59 | Admitting: Family Medicine

## 2017-10-23 VITALS — BP 138/72 | HR 84 | Ht 68.0 in | Wt 160.0 lb

## 2017-10-23 DIAGNOSIS — M545 Low back pain, unspecified: Secondary | ICD-10-CM

## 2017-10-23 DIAGNOSIS — F325 Major depressive disorder, single episode, in full remission: Secondary | ICD-10-CM | POA: Diagnosis not present

## 2017-10-23 DIAGNOSIS — Z5181 Encounter for therapeutic drug level monitoring: Secondary | ICD-10-CM

## 2017-10-23 DIAGNOSIS — F988 Other specified behavioral and emotional disorders with onset usually occurring in childhood and adolescence: Secondary | ICD-10-CM | POA: Diagnosis not present

## 2017-10-23 DIAGNOSIS — E559 Vitamin D deficiency, unspecified: Secondary | ICD-10-CM

## 2017-10-23 DIAGNOSIS — E781 Pure hyperglyceridemia: Secondary | ICD-10-CM

## 2017-10-23 DIAGNOSIS — K219 Gastro-esophageal reflux disease without esophagitis: Secondary | ICD-10-CM

## 2017-10-23 MED ORDER — CYCLOBENZAPRINE HCL 10 MG PO TABS: ORAL_TABLET | ORAL | 0 refills | Status: DC

## 2017-10-23 MED ORDER — PANTOPRAZOLE SODIUM 40 MG PO TBEC: 40.0000 mg | DELAYED_RELEASE_TABLET | Freq: Every day | ORAL | 1 refills | Status: DC

## 2017-10-23 MED ORDER — METHYLPHENIDATE HCL 10 MG PO TABS: ORAL_TABLET | ORAL | 0 refills | Status: DC

## 2017-10-23 NOTE — Patient Instructions (Addendum)
Go to the [facility] on [address] (they are open 8-12 on Saturdays). You need to be fasting and bring the lab slip with you.  Try and find out the date of your 2nd Shingrix so we can put it in your chart.

## 2017-11-24 ENCOUNTER — Other Ambulatory Visit (HOSPITAL_COMMUNITY)
Admission: RE | Admit: 2017-11-24 | Discharge: 2017-11-24 | Disposition: A | Discharge status: Home / Self Care | Payer: 59 | Source: Ambulatory Visit | Attending: Obstetrics & Gynecology | Admitting: Obstetrics & Gynecology

## 2017-11-24 ENCOUNTER — Ambulatory Visit: Payer: 59 | Admitting: Obstetrics & Gynecology

## 2017-11-24 ENCOUNTER — Encounter: Payer: Self-pay | Admitting: Obstetrics & Gynecology

## 2017-11-24 VITALS — BP 110/70 | HR 76 | Resp 16 | Ht 68.0 in | Wt 164.2 lb

## 2017-11-24 DIAGNOSIS — Z0000 Encounter for general adult medical examination without abnormal findings: Secondary | ICD-10-CM | POA: Diagnosis not present

## 2017-11-24 DIAGNOSIS — E2839 Other primary ovarian failure: Secondary | ICD-10-CM | POA: Diagnosis not present

## 2017-11-24 DIAGNOSIS — Z01419 Encounter for gynecological examination (general) (routine) without abnormal findings: Secondary | ICD-10-CM

## 2017-11-24 DIAGNOSIS — Z124 Encounter for screening for malignant neoplasm of cervix: Secondary | ICD-10-CM | POA: Insufficient documentation

## 2017-11-24 MED ORDER — ESTRADIOL-NORETHINDRONE ACET 0.05-0.25 MG/DAY TD PTTW: MEDICATED_PATCH | TRANSDERMAL | 4 refills | Status: DC

## 2017-11-24 NOTE — Progress Notes (Signed)
57 y.o. [id number] MarriedCaucasianF here for annual exam.  Doing well.  Denies vaginal bleeding.     Patient's last menstrual period was 09/01/2012.          Sexually active: Yes.    The current method of family planning is post menopausal status.    Exercising: Yes.    walking, gym  Smoker:  no  Health Maintenance: Pap:  05/23/15 Neg. HR HPV:neg   02/19/12 Neg  History of abnormal Pap:  no MMG:  01/01/17 BIRADS1:Neg  Colonoscopy:  05/07/17 polyps. F/u 5 years  BMD:   Never TDaP:  2016 Pneumonia vaccine(s):  n/a Shingrix: completed 2018 Hep C testing: 05/23/15 Neg  Screening Labs: PCP (has this ordered to do at a outpatient lab)   reports that she has never smoked. She has never used smokeless tobacco. She reports that she drinks about 3.0 oz of alcohol per week. She reports that she does not use drugs.  Past Medical History:  Diagnosis Date  . Hemorrhoids, internal 07/05/15   banding;Dr.Medoff  . Low back pain    chronic "back aches"  . Melanoma ([place]) end of 2014   left leg  . Postmenopausal   . Postpartum depression   . Seasonal allergies   . Yeast infection    history     Past Surgical History:  Procedure Laterality Date  . CESAREAN SECTION    . MELANOMA EXCISION Left    Leg  . NOSE SURGERY    . WISDOM TOOTH EXTRACTION      Current Outpatient Medications  Medication Sig Dispense Refill  . buPROPion (WELLBUTRIN XL) 150 MG 24 hr tablet Take 1 tablet (150 mg total) by mouth daily. 90 tablet 3  . calcium carbonate (OS-CAL) 600 MG TABS tablet Take 600 mg by mouth daily with breakfast.    . cholecalciferol (VITAMIN D) 1000 units tablet Take 1,000 Units by mouth daily.    . COMBIPATCH 0.05-0.25 MG/DAY APPLY 1 PATCH TWICE WEEKLY 24 patch 0  . cyclobenzaprine (FLEXERIL) 10 MG tablet TAKE ONE TABLET BY MOUTH THREE TIMES A DAY AS NEEDED FOR MUSCLE SPASMS 30 tablet 0  . doxylamine, Sleep, (UNISOM) 25 MG tablet Take 25 mg by mouth at bedtime as needed.    . hydrocortisone  (PROCTO-MED HC) 2.5 % rectal cream Place 1 application rectally 2 (two) times daily. 30 g 1  . methylphenidate (RITALIN) 10 MG tablet 1 tablet by mouth twice daily as directed 60 tablet 0  . Multiple Vitamins-Minerals (MULTIVITAMIN ADULT PO) Take 1 tablet by mouth daily.    . pantoprazole (PROTONIX) 40 MG tablet Take 1 tablet (40 mg total) by mouth daily. 90 tablet 1  . sertraline (ZOLOFT) 100 MG tablet TAKE ONE AND ONE-HALF (1  1/2) TO 2 TABLETS BY MOUTH DAILY 60 tablet 0   No current facility-administered medications for this visit.     Family History  Problem Relation Age of Onset  . Emphysema Mother   . Hypertension Mother   . Migraines Mother   . Throat cancer Father        nonsmoker, h/o reflux  . Testicular cancer Father   . Colitis Sister   . Depression Sister   . Depression Daughter   . Depression Son   . Anxiety disorder Son   . Diabetes Neg Hx     Review of Systems  All other systems reviewed and are negative.   Exam:   BP 110/70 (BP Location: Right Arm, Patient Position: Sitting, Cuff Size:  Normal)   Pulse 76   Resp 16   Ht 5\' 8"  (1.727 m)   Wt 164 lb 3.2 oz (74.5 kg)   LMP 09/01/2012   BMI 24.97 kg/m   Height: 5\' 8"  (172.7 cm)  Ht Readings from Last 3 Encounters:  11/24/17 5\' 8"  (1.727 m)  10/23/17 5\' 8"  (1.727 m)  09/30/16 5\' 8"  (1.727 m)    General appearance: alert, cooperative and appears stated age Head: Normocephalic, without obvious abnormality, atraumatic Neck: no adenopathy, supple, symmetrical, trachea midline and thyroid normal to inspection and palpation Lungs: clear to auscultation bilaterally Breasts: normal appearance, no masses or tenderness Heart: regular rate and rhythm Abdomen: soft, non-tender; bowel sounds normal; no masses,  no organomegaly Extremities: extremities normal, atraumatic, no cyanosis or edema Skin: Skin color, texture, turgor normal. No rashes or lesions Lymph nodes: Cervical, supraclavicular, and axillary nodes  normal. No abnormal inguinal nodes palpated Neurologic: Grossly normal   Pelvic: External genitalia:  no lesions              Urethra:  normal appearing urethra with no masses, tenderness or lesions              Bartholins and Skenes: normal                 Vagina: normal appearing vagina with normal color and discharge, no lesions              Cervix: no lesions              Pap taken: Yes.   Bimanual Exam:  Uterus:  normal size, contour, position, consistency, mobility, non-tender              Adnexa: normal adnexa and no mass, fullness, tenderness               Rectovaginal: Confirms               Anus:  normal sphincter tone, no lesions  Chaperone was present for exam.  A:  Well Woman with normal exam PMP, on HRT Hemorrhoids H/O depression GERD H/O melanoma followed by Dr. Ledell Peoples PA  P:   Mammogram guidelines reviewed.  Planing on doing BMD with MMG in August.  Order placed. Pap smear and HR HPV obtained today Lab work orders have been placed by Dr. Tomi Bamberger.  She is going to do these here today.  Non fasting. Rx for Combipatch 50/25.  One patch twice weekly.  #24/3RF return annually or prn

## 2017-11-25 LAB — CBC
HEMATOCRIT: 40.7 % (ref 34.0–46.6)
Hemoglobin: 13.4 g/dL (ref 11.1–15.9)
MCH: 30.8 pg (ref 26.6–33.0)
MCHC: 32.9 g/dL (ref 31.5–35.7)
MCV: 94 fL (ref 79–97)
Platelets: 270 10*3/uL (ref 150–450)
RBC: 4.35 x10E6/uL (ref 3.77–5.28)
RDW: 12.8 % (ref 12.3–15.4)
WBC: 5.3 10*3/uL (ref 3.4–10.8)

## 2017-11-25 LAB — LIPID PANEL
Chol/HDL Ratio: 2.8 ratio (ref 0.0–4.4)
Cholesterol, Total: 195 mg/dL (ref 100–199)
HDL: 70 mg/dL (ref >39)
LDL Calculated: 85 mg/dL (ref 0–99)
Triglycerides: 198 mg/dL — ABNORMAL HIGH (ref 0–149)
VLDL Cholesterol Cal: 40 mg/dL (ref 5–40)

## 2017-11-25 LAB — COMPREHENSIVE METABOLIC PANEL
A/G RATIO: 2.2 (ref 1.2–2.2)
ALT: 12 IU/L (ref 0–32)
AST: 19 IU/L (ref 0–40)
Albumin: 4.4 g/dL (ref 3.5–5.5)
Alkaline Phosphatase: 70 IU/L (ref 39–117)
BUN / CREAT RATIO: 16 (ref 9–23)
BUN: 12 mg/dL (ref 6–24)
Bilirubin Total: 0.5 mg/dL (ref 0.0–1.2)
CALCIUM: 9.6 mg/dL (ref 8.7–10.2)
CO2: 26 mmol/L (ref 20–29)
Chloride: 103 mmol/L (ref 96–106)
Creatinine, Ser: 0.77 mg/dL (ref 0.57–1.00)
GFR calc non Af Amer: 87 mL/min/{1.73_m2} (ref >59)
GFR, EST AFRICAN AMERICAN: 101 mL/min/{1.73_m2} (ref >59)
GLOBULIN, TOTAL: 2 g/dL (ref 1.5–4.5)
Glucose: 74 mg/dL (ref 65–99)
POTASSIUM: 4.4 mmol/L (ref 3.5–5.2)
SODIUM: 141 mmol/L (ref 134–144)
TOTAL PROTEIN: 6.4 g/dL (ref 6.0–8.5)

## 2017-11-25 LAB — TSH: TSH: 0.591 u[IU]/mL (ref 0.450–4.500)

## 2017-11-25 LAB — VITAMIN D 25 HYDROXY (VIT D DEFICIENCY, FRACTURES): VIT D 25 HYDROXY: 24 ng/mL — AB (ref 30.0–100.0)

## 2017-11-26 ENCOUNTER — Encounter: Payer: Self-pay | Admitting: Obstetrics & Gynecology

## 2017-11-26 ENCOUNTER — Other Ambulatory Visit: Payer: Self-pay | Admitting: Family Medicine

## 2017-11-26 ENCOUNTER — Telehealth: Payer: Self-pay | Admitting: Obstetrics & Gynecology

## 2017-11-26 DIAGNOSIS — Z1231 Encounter for screening mammogram for malignant neoplasm of breast: Secondary | ICD-10-CM

## 2017-11-26 LAB — CYTOLOGY - PAP
Diagnosis: NEGATIVE
HPV (WINDOPATH): NOT DETECTED

## 2017-11-26 NOTE — Telephone Encounter (Signed)
Patient sent the following correspondence through [place] [facility]. Routing to triage to assist patient with request.  ----- Message from [place], Generic sent at 11/26/2017 10:23 AM EDT -----    Hi Dr. Sabra Heck. I actually had a big dish of pasta right before the visit to you - thus the high triglycerides on the blood test. I will try to be better about taking my Vit D! Thanks so much.   ----- Message from [place], Generic sent at 11/26/2017 10:24 AM EDT -----    Oh, and yes, if you could send a copy to Dr. Tomi Bamberger, [place] appreciate it.   ----- Message from [place], Generic sent at 11/26/2017 10:27 AM EDT -----    Sorry for the string of messages!! One more thing - could you or someone in your office sign this form for my insurance? Thank you so much, again.

## 2017-11-26 NOTE — Telephone Encounter (Signed)
1.  11/24/17 Lab results faxed via Epic to PCP/ Dr. Tomi Bamberger  2. Attached form completed and placed on Dr. Ammie Ferrier desk for review and signature.

## 2017-11-28 NOTE — Telephone Encounter (Signed)
Called patient. Physical form will be faxed back to patient. Needs patient signature and waist circumference. Patient will be responsible for sending form to employer.   Patient will call back with fax number.

## 2017-11-28 NOTE — Telephone Encounter (Signed)
Form faxed.   Traci Welch  Encounter closed.

## 2017-11-28 NOTE — Telephone Encounter (Signed)
Fx: [phone]

## 2017-12-08 ENCOUNTER — Other Ambulatory Visit: Payer: Self-pay | Admitting: Family Medicine

## 2017-12-08 DIAGNOSIS — F325 Major depressive disorder, single episode, in full remission: Secondary | ICD-10-CM

## 2017-12-09 NOTE — Telephone Encounter (Signed)
Is this ok to refill?  

## 2017-12-17 ENCOUNTER — Other Ambulatory Visit: Payer: Self-pay | Admitting: Family Medicine

## 2017-12-17 ENCOUNTER — Encounter: Payer: Self-pay | Admitting: Family Medicine

## 2017-12-17 DIAGNOSIS — M545 Low back pain, unspecified: Secondary | ICD-10-CM

## 2017-12-17 NOTE — Telephone Encounter (Signed)
Is this okay to refill? 

## 2017-12-17 NOTE — Telephone Encounter (Signed)
Patient is using this about every other day.

## 2017-12-17 NOTE — Telephone Encounter (Signed)
This was last filled #30 in May (2 mos ago or less)--confirm she really needs this

## 2017-12-17 NOTE — Telephone Encounter (Signed)
rx for flexeril previously lasted 4-6 months--this is a significant change. Will contact pt to schedule visit to discuss, vs referral for PT. MyChart message sent.

## 2017-12-18 NOTE — Telephone Encounter (Signed)
I sent pt a MyChart message, but as of this evening, she has not yet read it. Please advise pt of message (or for her to check her MyChart to read message from doctor regarding her prescription refill request).

## 2017-12-19 NOTE — Telephone Encounter (Signed)
Pt will read this and reply to the email

## 2018-01-19 ENCOUNTER — Ambulatory Visit
Admission: RE | Admit: 2018-01-19 | Discharge: 2018-01-19 | Disposition: A | Discharge status: Home / Self Care | Payer: 59 | Source: Ambulatory Visit | Attending: Family Medicine | Admitting: Family Medicine

## 2018-01-19 ENCOUNTER — Ambulatory Visit
Admission: RE | Admit: 2018-01-19 | Discharge: 2018-01-19 | Disposition: A | Discharge status: Home / Self Care | Payer: 59 | Source: Ambulatory Visit | Attending: Obstetrics & Gynecology | Admitting: Obstetrics & Gynecology

## 2018-01-19 DIAGNOSIS — Z780 Asymptomatic menopausal state: Secondary | ICD-10-CM | POA: Diagnosis not present

## 2018-01-19 DIAGNOSIS — Z1231 Encounter for screening mammogram for malignant neoplasm of breast: Secondary | ICD-10-CM

## 2018-01-19 DIAGNOSIS — M85851 Other specified disorders of bone density and structure, right thigh: Secondary | ICD-10-CM | POA: Diagnosis not present

## 2018-01-19 DIAGNOSIS — E2839 Other primary ovarian failure: Secondary | ICD-10-CM

## 2018-01-20 ENCOUNTER — Other Ambulatory Visit: Payer: Self-pay | Admitting: Family Medicine

## 2018-01-20 DIAGNOSIS — M545 Low back pain, unspecified: Secondary | ICD-10-CM

## 2018-01-20 MED ORDER — CYCLOBENZAPRINE HCL 10 MG PO TABS: ORAL_TABLET | ORAL | 0 refills | Status: DC

## 2018-01-20 NOTE — Telephone Encounter (Signed)
This okay

## 2018-04-27 DIAGNOSIS — Z23 Encounter for immunization: Secondary | ICD-10-CM | POA: Diagnosis not present

## 2018-06-02 DIAGNOSIS — L820 Inflamed seborrheic keratosis: Secondary | ICD-10-CM | POA: Diagnosis not present

## 2018-06-02 DIAGNOSIS — L814 Other melanin hyperpigmentation: Secondary | ICD-10-CM | POA: Diagnosis not present

## 2018-06-02 DIAGNOSIS — D225 Melanocytic nevi of trunk: Secondary | ICD-10-CM | POA: Diagnosis not present

## 2018-06-02 DIAGNOSIS — L821 Other seborrheic keratosis: Secondary | ICD-10-CM | POA: Diagnosis not present

## 2018-09-07 ENCOUNTER — Encounter: Payer: Self-pay | Admitting: Family Medicine

## 2018-09-08 MED ORDER — HYDROCORTISONE 2.5 % RE CREA: 1.0000 "application " | TOPICAL_CREAM | Freq: Two times a day (BID) | RECTAL | 1 refills | Status: DC

## 2018-11-09 ENCOUNTER — Telehealth: Payer: Self-pay | Admitting: Family Medicine

## 2018-11-09 NOTE — Telephone Encounter (Signed)
Patient stated she is not in need of a refill at this time. She was calling to make sure when her next refill is needed, it is sent to [facility] mail [facility]. Patient stated she will call and schedule a med check before she needs a refill but she's good for June. She will also have labs done in fall with GYN.

## 2018-11-09 NOTE — Telephone Encounter (Signed)
She hasn't been seen in the office in over a year, with no upcoming visits scheduled.  She needs med check.  Can be done virtually. Looking at chart, last year her D level was low, and TG were mildly elevated.  These were done through her GYN.  We can repeat now (do fasting lab visit, with vitals at visit), or she can wait and have those rechecked at her GYN visit this Fall (she does have that appt scheduled).  Only okay for 30d supply to last until she can schedule visit; at visit can do longer rx's. (if she has enough to last until a visit, just schedule visit and I will do the mail order at visit).

## 2018-11-09 NOTE — Telephone Encounter (Signed)
Pharmacy sent refill request for sertraline hcl tab please send to [place], [place]

## 2018-12-29 ENCOUNTER — Other Ambulatory Visit: Payer: Self-pay | Admitting: Obstetrics & Gynecology

## 2018-12-30 NOTE — Telephone Encounter (Signed)
Medication refill request: Combi patch  Last AEX:  11/24/17 Next AEX: 03/09/19 Last MMG (if hormonal medication request): 01/19/18  Bi-rads 1 neg  Refill authorized: #24 with 0 RF

## 2019-01-06 ENCOUNTER — Ambulatory Visit: Payer: 59 | Admitting: Gastroenterology

## 2019-01-12 ENCOUNTER — Other Ambulatory Visit: Payer: Self-pay | Admitting: Family Medicine

## 2019-01-12 DIAGNOSIS — Z1231 Encounter for screening mammogram for malignant neoplasm of breast: Secondary | ICD-10-CM

## 2019-02-01 ENCOUNTER — Other Ambulatory Visit: Payer: Self-pay | Admitting: Family Medicine

## 2019-02-01 DIAGNOSIS — F325 Major depressive disorder, single episode, in full remission: Secondary | ICD-10-CM

## 2019-02-01 DIAGNOSIS — K219 Gastro-esophageal reflux disease without esophagitis: Secondary | ICD-10-CM

## 2019-02-02 ENCOUNTER — Other Ambulatory Visit: Payer: Self-pay | Admitting: Obstetrics & Gynecology

## 2019-02-02 NOTE — Telephone Encounter (Signed)
Pt made an appt for 02-25-19. In Office for labs . Pt was advised to fast . Pt was advise only a thrty day supply would be sent in. Please advise if any thing else is need due to me not being able to send in this script . [place]

## 2019-02-02 NOTE — Telephone Encounter (Signed)
Medication refill request: Combipatch Last AEX:  11/24/17 SM Next AEX: 03/09/19 Last MMG (if hormonal medication request): 01/19/18 BIRADS 1 negative/density d Refill authorized: Please advise on refill; Order pended #24 w/0 refills if authorized

## 2019-02-02 NOTE — Telephone Encounter (Signed)
Patient hasn't been seen in a year (10/2017).  Please see detailed message from 11/09/18 phone message.  She never scheduled visit after being requested then (at which time she was asking for rx to go to [facility], not [facility] as this request is for.  So, needs to have visit scheduled, read other message for options (labs, vs virtual). If needed, can auth another 30d supply (60 pills, NO refills) until visit.

## 2019-02-02 NOTE — Telephone Encounter (Signed)
[facility] is requesting to fill pt zoloft please advise . [place]

## 2019-02-05 MED ORDER — COMBIPATCH 0.05-0.25 MG/DAY TD PTTW: MEDICATED_PATCH | TRANSDERMAL | 0 refills | Status: DC

## 2019-02-11 ENCOUNTER — Other Ambulatory Visit: Payer: Self-pay

## 2019-02-11 ENCOUNTER — Ambulatory Visit
Admission: RE | Admit: 2019-02-11 | Discharge: 2019-02-11 | Disposition: A | Discharge status: Home / Self Care | Payer: 59 | Source: Ambulatory Visit | Attending: Family Medicine | Admitting: Family Medicine

## 2019-02-11 DIAGNOSIS — Z1231 Encounter for screening mammogram for malignant neoplasm of breast: Secondary | ICD-10-CM

## 2019-02-23 ENCOUNTER — Telehealth: Payer: Self-pay | Admitting: Obstetrics & Gynecology

## 2019-02-23 NOTE — Telephone Encounter (Signed)
Spoke with patient. Patient states she has checked with [facility] and [facility], Combipatch unavailable and unsure when they will be back in stock. Only has 1 patch left. Patient is requesting an alternative Rx to [employer] on file. Patient is aware this may be a pill and patch, she is ok with this if needed.   Dr. Sabra Heck -please advise on alternative Rx.

## 2019-02-23 NOTE — Telephone Encounter (Signed)
Patient is calling regarding Combipatch. Patient stated that the pharmacy is out of the medication and is requesting a replacement.

## 2019-02-24 NOTE — Progress Notes (Signed)
Chief Complaint  Patient presents with  . Depression    fasting med check, no concerns. Patient taking MVI and 1,000IU of vitamin D daily.     Patient presents for med check.She denies any specific concerns or complaints. Not seen in this office since 10/2017. She doesn't come for wellness exams here, just to GYN.  Sees derm for routine skin check (now yearly; h/o melanoma). She last saw Dr. Sabra Heck for GYN exam in June 2019, with normal pap; scheduled for 03/2019. She had mammogram 02/2019. She had colonoscopy 05/2017, hyperplastic polyps. (Dr. Collene Mares). Immunization History  Administered Date(s) Administered  . Influenza,inj,Quad PF,6+ Mos 01/19/2015, 04/27/2018  . Tdap 05/23/2015  . Zoster Recombinat (Shingrix) 10/22/2016  Pt reports she had the second Shingrix, within 6 months of the first. ([facility] on [address])   Depression: She is currently taking Zoloft 150mg . Has done well with moods, stopped wellbutrin over a year ago (started on both meds in 12/2014). Stressors last year included both children dealing with depression, anxiety, transgender.  She currently reports that work is less stressful. Working from home. Damon had surgery, name officially changed.  Liam/Jamie, taking hormones "they" Last filled zoloft 100mg  #60 on 02/02/2019  ADD: She was started back on Ritalin in 01/2015. She used to take 10mg  most workdays (not year-round, but during busier times of the year). She reports that the medicationis effective for4 hours, so she would take it around 11 am, just once daily. Denies side effects--no tics, insomnia.  Last written for #60 in 10/2017. She ran out in March, and hasn't really needed any since.  Not currently needed, will call when things get busier and more stressful.  GERD: Changed to pantoprazole in 01/2015. Shewas concernedabout possible longterm effects, so we had discussed ongoing PPI (and supplements) vs trial of H2 blockers in the  past.  She elected to use PPI only 3x/week, as needed, rather than daily, using it with larger meals.  She was doing well like this last year. She reports she had run out for a while, had recurrent symptoms of burping, heartburn.  Since she recently refilled the medication she has been taking it every other day and doing well. She denies chest pain or dysphagia. Last filled pantoprazole 40mg  #30 on 02/02/2019.  Vitamin D deficiency:  Last level was low at 24 in 11/2017 when checked by her GYN.  She was advised to increase her vitamin D dose to 2000 IU daily (had been taking 1000 IU plus MVI prior to test). She is currently taking 1000 IU plus MVI daily, thinks she may have increased to 2000 IU for a while, but currently at 1000 IU  Elevated TG noted on 05/2015 labs (TG 209). Repeat the following year was normal: Recent Labs       Lab Results  Component Value Date   CHOL 179 09/19/2016   HDL 55 09/19/2016   LDLCALC 100 (H) 09/19/2016   TRIG 119 09/19/2016   CHOLHDL 3.3 09/19/2016     She had lipids repeated by GYN in 11/2017--TG again elevated. She doesn't think she was fasting at her last visit.  She is fasting today. She reports she follows a lowfat diet.  Lab Results  Component Value Date   CHOL 195 11/24/2017   HDL 70 11/24/2017   LDLCALC 85 11/24/2017   TRIG 198 (H) 11/24/2017   CHOLHDL 2.8 11/24/2017   Back pain: She has pain when sitting at her computer for a prolonged time.  She tried some lumbar support pillows which didn't help.  She hasn't had a flare of her back going "out", just the LBP with sitting.  Previously used flexeril prn for spasms. Last filled #30 in [place].  PMH, PSH, SH reviewed  Outpatient Encounter Medications as of 02/25/2019  Medication Sig Note  . calcium carbonate (OS-CAL) 600 MG TABS tablet Take 600 mg by mouth daily with breakfast.   . cholecalciferol (VITAMIN D) 1000 units tablet Take 1,000 Units by mouth daily.   . cyclobenzaprine (FLEXERIL) 10  MG tablet TAKE ONE TABLET BY MOUTH THREE TIMES A DAY AS NEEDED FOR MUSCLE SPASMS   . doxylamine, Sleep, (UNISOM) 25 MG tablet Take 25 mg by mouth at bedtime as needed.   Marland Kitchen estradiol-norethindrone (COMBIPATCH) 0.05-0.25 MG/DAY APPLY 1 PATCH TOPICALLY TO  SKIN TWICE WEEKLY   . hydrocortisone (PROCTO-MED HC) 2.5 % rectal cream Place 1 application rectally 2 (two) times daily.   . Multiple Vitamins-Minerals (MULTIVITAMIN ADULT PO) Take 1 tablet by mouth daily.   . pantoprazole (PROTONIX) 40 MG tablet Take 1 tablet (40 mg total) by mouth daily.   . sertraline (ZOLOFT) 100 MG tablet Take 1.5 tablets (150 mg total) by mouth at bedtime.   . [DISCONTINUED] pantoprazole (PROTONIX) 40 MG tablet TAKE ONE TABLET BY MOUTH DAILY   . [DISCONTINUED] sertraline (ZOLOFT) 100 MG tablet TAKE ONE AND ONE-HALF (1  1/2) TABLETS  TO TWO TABLETS BY MOUTH DAILY AS DIRECTED.   Marland Kitchen methylphenidate (RITALIN) 10 MG tablet 1 tablet by mouth twice daily as directed   . [DISCONTINUED] buPROPion (WELLBUTRIN XL) 150 MG 24 hr tablet Take 1 tablet (150 mg total) by mouth daily. 10/23/2017: Uses sporadically   No facility-administered encounter medications on file as of 02/25/2019.    Allergies  Allergen Reactions  . Phenergan [Promethazine Hcl] Other (See Comments)    Loss consciousness    ROS:  No fever, chills, URI symptoms, cough, shortness of breath, headaches, dizziness, chest pain. Moods remain good/controlled.  No heartburn (since back on PPI qod), dysphagia, bowel changes, urinary complaints or other concerns. No skin complaints/concerns. Low back pain per HPI.  She has some intermittent urinary frequency, h/o IC, but currently is without any urinary complaints.   PHYSICAL EXAM:  BP 120/70   Pulse 76   Temp (!) 97.5 F (36.4 C) (Other (Comment))   Ht 5\' 8"  (1.727 m)   Wt 167 lb 9.6 oz (76 kg)   LMP 09/01/2012   BMI 25.48 kg/m    Wt Readings from Last 3 Encounters:  02/25/19 167 lb 9.6 oz (76 kg)  11/24/17 164 lb  3.2 oz (74.5 kg)  10/23/17 160 lb (72.6 kg)    Well appearing, pleasant female in no distress HEENT: conjunctiva and sclera are clear, EOMI.  Wearing mask Neck: no lymphadenopathy, thyromegaly or carotid bruit Heart: regular rate and rhythm without murmur Lungs: clear bilaterally Back: no spinal or CVA tenderness. Nontender at [employer] joints and muscles. Abdomen: soft, nontender, no organomegaly or mass Extremities: no edema, 2+ pulses Skin: normal turgor, no rash Psych: normal mood, affect, hygiene and grooming Neuro: alert and oriented, cranial nerves intact. Normal gait   ASSESSMENT/PLAN:  Vitamin D deficiency - on same supplements as last check, when was low. Recheck today. Advised of need for 2000 IU plus MVI long-term - Plan: VITAMIN D 25 Hydroxy (Vit-D Deficiency, Fractures)  ADD (attention deficit disorder) without hyperactivity - not currently requiring medications.  Will contact us if/when needed to refill the  ritalin  Hypertriglyceridemia - may not have been fasting on last check. Reviewed lowfat diet. Recheck today as she is fasting - Plan: Lipid panel  Need for influenza vaccination - Plan: Flu Vaccine QUAD 6+ mos PF IM (Fluarix Quad PF)  Medication monitoring encounter - Plan: VITAMIN D 25 Hydroxy (Vit-D Deficiency, Fractures), CBC with Differential/Platelet, Comprehensive metabolic panel  Other fatigue - Plan: TSH, VITAMIN D 25 Hydroxy (Vit-D Deficiency, Fractures), CBC with Differential/Platelet, Comprehensive metabolic panel  Weight gain - counseled re: diet, exercise - Plan: TSH  Depression, major, in remission ([place]) - well controlled on zoloft alone, continue 150mg . - Plan: sertraline (ZOLOFT) 100 MG tablet  Gastroesophageal reflux disease, esophagitis presence not specified - doing well on PPI qod.  Discussed risks of PPI vs untreated reflux, and also Pepcid/H2 blockers. - Plan: pantoprazole (PROTONIX) 40 MG tablet     Addendum 02/26/2019--Veronica contacted  pharmacies, only saw 1 Shingrix.  Pt also called, didn't find any (but clearly she had one).  Plans to get second, even though very late.  (see messages)

## 2019-02-25 ENCOUNTER — Ambulatory Visit: Payer: 59 | Admitting: Family Medicine

## 2019-02-25 ENCOUNTER — Encounter: Payer: Self-pay | Admitting: Family Medicine

## 2019-02-25 ENCOUNTER — Other Ambulatory Visit: Payer: Self-pay

## 2019-02-25 VITALS — BP 120/70 | HR 76 | Temp 97.5°F | Ht 68.0 in | Wt 167.6 lb

## 2019-02-25 DIAGNOSIS — E559 Vitamin D deficiency, unspecified: Secondary | ICD-10-CM

## 2019-02-25 DIAGNOSIS — E781 Pure hyperglyceridemia: Secondary | ICD-10-CM | POA: Diagnosis not present

## 2019-02-25 DIAGNOSIS — F988 Other specified behavioral and emotional disorders with onset usually occurring in childhood and adolescence: Secondary | ICD-10-CM

## 2019-02-25 DIAGNOSIS — Z23 Encounter for immunization: Secondary | ICD-10-CM | POA: Diagnosis not present

## 2019-02-25 DIAGNOSIS — R5383 Other fatigue: Secondary | ICD-10-CM

## 2019-02-25 DIAGNOSIS — Z5181 Encounter for therapeutic drug level monitoring: Secondary | ICD-10-CM

## 2019-02-25 DIAGNOSIS — R635 Abnormal weight gain: Secondary | ICD-10-CM

## 2019-02-25 DIAGNOSIS — K219 Gastro-esophageal reflux disease without esophagitis: Secondary | ICD-10-CM

## 2019-02-25 DIAGNOSIS — F325 Major depressive disorder, single episode, in full remission: Secondary | ICD-10-CM

## 2019-02-25 MED ORDER — SERTRALINE HCL 100 MG PO TABS: 150.0000 mg | ORAL_TABLET | Freq: Every day | ORAL | 11 refills | Status: DC

## 2019-02-25 MED ORDER — PANTOPRAZOLE SODIUM 40 MG PO TBEC: 40.0000 mg | DELAYED_RELEASE_TABLET | Freq: Every day | ORAL | 11 refills | Status: DC

## 2019-02-25 NOTE — Patient Instructions (Addendum)
If you can get [place] the date of your second Shingrix to enter into your vaccination record, that would be great.  Contact us when you need Ritalin refill.  Increase your vitamin D dose to 2000 IU daily, along with your multivitamin (based on Dr. Ammie Ferrier prior recommendation when your level was low last year--we will give more advice when labs are back). You likely will at least need the higher dose during October through April.  Here are some exercises that may help with your back.  Back Exercises The following exercises strengthen the muscles that help to support the trunk and back. They also help to keep the lower back flexible. Doing these exercises can help to prevent back pain or lessen existing pain.  If you have back pain or discomfort, try doing these exercises 2-3 times each day or as told by your health care provider.  As your pain improves, do them once each day, but increase the number of times that you repeat the steps for each exercise (do more repetitions).  To prevent the recurrence of back pain, continue to do these exercises once each day or as told by your health care provider. Do exercises exactly as told by your health care provider and adjust them as directed. It is normal to feel mild stretching, pulling, tightness, or discomfort as you do these exercises, but you should stop right away if you feel sudden pain or your pain gets worse. Exercises Single knee to chest Repeat these steps 3-5 times for each leg: 1. Lie on your back on a firm bed or the floor with your legs extended. 2. Bring one knee to your chest. Your other leg should stay extended and in contact with the floor. 3. Hold your knee in place by grabbing your knee or thigh with both hands and hold. 4. Pull on your knee until you feel a gentle stretch in your lower back or buttocks. 5. Hold the stretch for 10-30 seconds. 6. Slowly release and straighten your leg. Pelvic tilt Repeat these steps 5-10 times: 1.  Lie on your back on a firm bed or the floor with your legs extended. 2. Bend your knees so they are pointing toward the ceiling and your feet are flat on the floor. 3. Tighten your lower abdominal muscles to press your lower back against the floor. This motion will tilt your pelvis so your tailbone points up toward the ceiling instead of pointing to your feet or the floor. 4. With gentle tension and even breathing, hold this position for 5-10 seconds. Cat-cow Repeat these steps until your lower back becomes more flexible: 1. Get into a hands-and-knees position on a firm surface. Keep your hands under your shoulders, and keep your knees under your hips. You may place padding under your knees for comfort. 2. Let your head hang down toward your chest. Contract your abdominal muscles and point your tailbone toward the floor so your lower back becomes rounded like the back of a cat. 3. Hold this position for 5 seconds. 4. Slowly lift your head, let your abdominal muscles relax and point your tailbone up toward the ceiling so your back forms a sagging arch like the back of a cow. 5. Hold this position for 5 seconds.  Press-ups Repeat these steps 5-10 times: 1. Lie on your abdomen (face-down) on the floor. 2. Place your palms near your head, about shoulder-width apart. 3. Keeping your back as relaxed as possible and keeping your hips on the floor, slowly  straighten your arms to raise the top half of your body and lift your shoulders. Do not use your back muscles to raise your upper torso. You may adjust the placement of your hands to make yourself more comfortable. 4. Hold this position for 5 seconds while you keep your back relaxed. 5. Slowly return to lying flat on the floor.  Bridges Repeat these steps 10 times: 1. Lie on your back on a firm surface. 2. Bend your knees so they are pointing toward the ceiling and your feet are flat on the floor. Your arms should be flat at your sides, next to your  body. 3. Tighten your buttocks muscles and lift your buttocks off the floor until your waist is at almost the same height as your knees. You should feel the muscles working in your buttocks and the back of your thighs. If you do not feel these muscles, slide your feet 1-2 inches farther away from your buttocks. 4. Hold this position for 3-5 seconds. 5. Slowly lower your hips to the starting position, and allow your buttocks muscles to relax completely. If this exercise is too easy, try doing it with your arms crossed over your chest. Abdominal crunches Repeat these steps 5-10 times: 1. Lie on your back on a firm bed or the floor with your legs extended. 2. Bend your knees so they are pointing toward the ceiling and your feet are flat on the floor. 3. Cross your arms over your chest. 4. Tip your chin slightly toward your chest without bending your neck. 5. Tighten your abdominal muscles and slowly raise your trunk (torso) high enough to lift your shoulder blades a tiny bit off the floor. Avoid raising your torso higher than that because it can put too much stress on your low back and does not help to strengthen your abdominal muscles. 6. Slowly return to your starting position. Back lifts Repeat these steps 5-10 times: 1. Lie on your abdomen (face-down) with your arms at your sides, and rest your forehead on the floor. 2. Tighten the muscles in your legs and your buttocks. 3. Slowly lift your chest off the floor while you keep your hips pressed to the floor. Keep the back of your head in line with the curve in your back. Your eyes should be looking at the floor. 4. Hold this position for 3-5 seconds. 5. Slowly return to your starting position. Contact a health care provider if:  Your back pain or discomfort gets much worse when you do an exercise.  Your worsening back pain or discomfort does not lessen within 2 hours after you exercise. If you have any of these problems, stop doing these  exercises right away. Do not do them again unless your health care provider says that you can. Get help right away if:  You develop sudden, severe back pain. If this happens, stop doing the exercises right away. Do not do them again unless your health care provider says that you can. This information is not intended to replace advice given to you by your health care provider. Make sure you discuss any questions you have with your health care provider. Document Released: 06/27/2004 Document Revised: 09/24/2018 Document Reviewed: 02/19/2018 Elsevier Patient Education  2020 [employer].

## 2019-02-25 NOTE — Telephone Encounter (Signed)
Patient is returning call to Jill. °

## 2019-02-26 ENCOUNTER — Encounter: Payer: Self-pay | Admitting: Family Medicine

## 2019-02-26 LAB — COMPREHENSIVE METABOLIC PANEL
ALT: 13 IU/L (ref 0–32)
AST: 18 IU/L (ref 0–40)
Albumin/Globulin Ratio: 1.9 (ref 1.2–2.2)
Albumin: 3.7 g/dL — ABNORMAL LOW (ref 3.8–4.9)
Alkaline Phosphatase: 59 IU/L (ref 39–117)
BUN/Creatinine Ratio: 15 (ref 9–23)
BUN: 12 mg/dL (ref 6–24)
Bilirubin Total: 0.4 mg/dL (ref 0.0–1.2)
CO2: 22 mmol/L (ref 20–29)
Calcium: 8.9 mg/dL (ref 8.7–10.2)
Chloride: 104 mmol/L (ref 96–106)
Creatinine, Ser: 0.78 mg/dL (ref 0.57–1.00)
GFR calc Af Amer: 98 mL/min/{1.73_m2} (ref >59)
GFR calc non Af Amer: 85 mL/min/{1.73_m2} (ref >59)
Globulin, Total: 1.9 g/dL (ref 1.5–4.5)
Glucose: 104 mg/dL — ABNORMAL HIGH (ref 65–99)
Potassium: 5.4 mmol/L — ABNORMAL HIGH (ref 3.5–5.2)
Sodium: 136 mmol/L (ref 134–144)
Total Protein: 5.6 g/dL — ABNORMAL LOW (ref 6.0–8.5)

## 2019-02-26 LAB — CBC WITH DIFFERENTIAL/PLATELET
Basophils Absolute: 0.1 10*3/uL (ref 0.0–0.2)
Basos: 1 %
EOS (ABSOLUTE): 0.1 10*3/uL (ref 0.0–0.4)
Eos: 1 %
Hematocrit: 44.8 % (ref 34.0–46.6)
Hemoglobin: 15.5 g/dL (ref 11.1–15.9)
Immature Grans (Abs): 0 10*3/uL (ref 0.0–0.1)
Immature Granulocytes: 1 %
Lymphocytes Absolute: 2 10*3/uL (ref 0.7–3.1)
Lymphs: 33 %
MCH: 32.1 pg (ref 26.6–33.0)
MCHC: 34.6 g/dL (ref 31.5–35.7)
MCV: 93 fL (ref 79–97)
Monocytes Absolute: 0.4 10*3/uL (ref 0.1–0.9)
Monocytes: 7 %
Neutrophils Absolute: 3.5 10*3/uL (ref 1.4–7.0)
Neutrophils: 57 %
Platelets: 279 10*3/uL (ref 150–450)
RBC: 4.83 x10E6/uL (ref 3.77–5.28)
RDW: 11.8 % (ref 11.7–15.4)
WBC: 6.1 10*3/uL (ref 3.4–10.8)

## 2019-02-26 LAB — LIPID PANEL
Chol/HDL Ratio: 3.3 ratio (ref 0.0–4.4)
Cholesterol, Total: 199 mg/dL (ref 100–199)
HDL: 60 mg/dL (ref >39)
LDL Chol Calc (NIH): 124 mg/dL — ABNORMAL HIGH (ref 0–99)
Triglycerides: 82 mg/dL (ref 0–149)
VLDL Cholesterol Cal: 15 mg/dL (ref 5–40)

## 2019-02-26 LAB — VITAMIN D 25 HYDROXY (VIT D DEFICIENCY, FRACTURES): Vit D, 25-Hydroxy: 23.7 ng/mL — ABNORMAL LOW (ref 30.0–100.0)

## 2019-02-26 LAB — TSH: TSH: 0.862 u[IU]/mL (ref 0.450–4.500)

## 2019-02-26 MED ORDER — ESTRADIOL 0.05 MG/24HR TD PTTW: 1.0000 | MEDICATED_PATCH | TRANSDERMAL | 1 refills | Status: DC

## 2019-02-26 MED ORDER — MEDROXYPROGESTERONE ACETATE 2.5 MG PO TABS: 2.5000 mg | ORAL_TABLET | Freq: Every day | ORAL | 1 refills | Status: DC

## 2019-02-26 NOTE — Telephone Encounter (Signed)
I am so sorry.  I thought I completed this.  I sent a prscription for the vivelle dot 0.05mg  patches to the pharmacy on file.  She will use this exactly as she takes the combipatch.  Apply to skin twice weekly.  I also sent a prescription for prover 5mg  daily.  She will take this orally and it doesn't matter when she takes it.  I wrote these for 90 day supplies as combipatch has no delivery date to the pharmacy.  Can change this to 30 day supply if she desires.  Thanks.

## 2019-02-26 NOTE — Telephone Encounter (Signed)
Spoke with patient. Patient states she is now out of her patches, requesting f/u on alternative Rx. Advised patient I have forwarded her request to Dr. Sabra Heck, awaiting response. Our office will notify with recommendations once reviewed.    Routing to Dr. Sabra Heck.

## 2019-02-26 NOTE — Telephone Encounter (Signed)
Spoke with patient, advised as seen below per Dr. Miller. Patient verbalizes understanding and is agreeable.   Encounter closed.  

## 2019-02-27 ENCOUNTER — Encounter: Payer: Self-pay | Admitting: Family Medicine

## 2019-02-27 DIAGNOSIS — E559 Vitamin D deficiency, unspecified: Secondary | ICD-10-CM

## 2019-02-27 MED ORDER — VITAMIN D (ERGOCALCIFEROL) 1.25 MG (50000 UNIT) PO CAPS: 50000.0000 [IU] | ORAL_CAPSULE | ORAL | 0 refills | Status: DC

## 2019-03-03 ENCOUNTER — Telehealth: Payer: Self-pay | Admitting: Obstetrics & Gynecology

## 2019-03-03 ENCOUNTER — Encounter: Payer: Self-pay | Admitting: Family Medicine

## 2019-03-03 ENCOUNTER — Encounter: Payer: Self-pay | Admitting: Obstetrics & Gynecology

## 2019-03-03 NOTE — Telephone Encounter (Signed)
Patient advised.

## 2019-03-03 NOTE — Telephone Encounter (Signed)
Patient sent the following message through [place]. Routing to triage to assist patient with request.  Traci Welch "[name]"  P [facility] Pool  Phone Number: [phone]        I thought I got my second Shingrix vaccine, but I guess I never did. I think it was because the supply was low at the time. I got my first one in 2018. So [facility] went to [facility] to [facility] my second Shingrix vaccine, but my insurance [facility]) rejected me for some reason. The pharmacist said I need to go to a Dr. office to get it. I have an appointment with you on Oct. 6th. Could I get it then?

## 2019-03-03 NOTE — Telephone Encounter (Signed)
Call to patient. Patient advised that our office does not give Shingrix vaccines. That we defer to PCP or Pharmacy. Patient states she will check with Dr. Johnsie Kindred office to see if she can receive there.   Routing to provider and will close encounter.

## 2019-03-05 ENCOUNTER — Other Ambulatory Visit: Payer: Self-pay

## 2019-03-08 ENCOUNTER — Encounter: Payer: Self-pay | Admitting: Family Medicine

## 2019-03-09 ENCOUNTER — Other Ambulatory Visit: Payer: Self-pay

## 2019-03-09 ENCOUNTER — Other Ambulatory Visit: Payer: Self-pay | Admitting: Obstetrics & Gynecology

## 2019-03-09 ENCOUNTER — Encounter: Payer: Self-pay | Admitting: Obstetrics & Gynecology

## 2019-03-09 ENCOUNTER — Ambulatory Visit: Payer: 59 | Admitting: Obstetrics & Gynecology

## 2019-03-09 VITALS — BP 110/70 | HR 72 | Temp 97.2°F | Ht 68.0 in | Wt 168.8 lb

## 2019-03-09 DIAGNOSIS — Z01419 Encounter for gynecological examination (general) (routine) without abnormal findings: Secondary | ICD-10-CM

## 2019-03-09 NOTE — Progress Notes (Signed)
58 y.o. [id number] Married White or Caucasian female here for annual exam.  Doing well.  Is working from home.  She was never furloughed.    Denies vaginal bleeding.  Has seen dermatology.    Patient's last menstrual period was 09/01/2012.          Sexually active: Yes.    The current method of family planning is post menopausal status.    Exercising: Yes.    walking  Smoker:  no  Health Maintenance: Pap:  11/24/17 Neg. HR HPV:neg   05/23/15 neg. HR HPV:neg  History of abnormal Pap:  no MMG:  02/11/19 BIRADS1:neg.  Grade D breast density. Colonoscopy:  05/07/17 polyps. F/u 5 years  BMD:   01/19/18 osteopenia  TDaP:  2016 Pneumonia vaccine(s):  n/a Shingrix:   Completed  Flu vacc: 02/25/19 Hep C testing: 05/23/15 Neg  Screening Labs: PCP.  Reviewed with pt today.     reports that she has never smoked. She has never used smokeless tobacco. She reports current alcohol use of about 10.0 standard drinks of alcohol per week. She reports that she does not use drugs.  Past Medical History:  Diagnosis Date  . Hemorrhoids, internal 07/05/15   banding;Dr.Medoff  . Low back pain    chronic "back aches"  . Melanoma ([place]) end of 2014   left leg  . Postmenopausal   . Postpartum depression   . Seasonal allergies   . Yeast infection    history     Past Surgical History:  Procedure Laterality Date  . CESAREAN SECTION    . MELANOMA EXCISION Left    Leg  . NOSE SURGERY    . WISDOM TOOTH EXTRACTION      Current Outpatient Medications  Medication Sig Dispense Refill  . calcium carbonate (OS-CAL) 600 MG TABS tablet Take 600 mg by mouth daily with breakfast.    . cholecalciferol (VITAMIN D) 1000 units tablet Take 1,000 Units by mouth daily.    . cyclobenzaprine (FLEXERIL) 10 MG tablet TAKE ONE TABLET BY MOUTH THREE TIMES A DAY AS NEEDED FOR MUSCLE SPASMS 30 tablet 0  . doxylamine, Sleep, (UNISOM) 25 MG tablet Take 25 mg by mouth at bedtime as needed.    Marland Kitchen estradiol (VIVELLE-DOT) 0.05 MG/24HR  patch Place 1 patch (0.05 mg total) onto the skin 2 (two) times a week. 24 patch 1  . hydrocortisone (PROCTO-MED HC) 2.5 % rectal cream Place 1 application rectally 2 (two) times daily. 30 g 1  . medroxyPROGESTERone (PROVERA) 2.5 MG tablet Take 1 tablet (2.5 mg total) by mouth daily. 90 tablet 1  . methylphenidate (RITALIN) 10 MG tablet 1 tablet by mouth twice daily as directed 60 tablet 0  . Multiple Vitamins-Minerals (MULTIVITAMIN ADULT PO) Take 1 tablet by mouth daily.    . pantoprazole (PROTONIX) 40 MG tablet Take 1 tablet (40 mg total) by mouth daily. 30 tablet 11  . sertraline (ZOLOFT) 100 MG tablet Take 1.5 tablets (150 mg total) by mouth at bedtime. 45 tablet 11  . Vitamin D, Ergocalciferol, (DRISDOL) 1.25 MG (50000 UT) CAPS capsule Take 1 capsule (50,000 Units total) by mouth every 7 (seven) days. 8 capsule 0   No current facility-administered medications for this visit.     Family History  Problem Relation Age of Onset  . Emphysema Mother   . Hypertension Mother   . Migraines Mother   . Throat cancer Father        nonsmoker, h/o reflux  . Testicular cancer Father   .  Colitis Sister   . Depression Sister   . Depression Daughter   . Depression Son   . Anxiety disorder Son   . Diabetes Neg Hx     Review of Systems  All other systems reviewed and are negative.   Exam:   BP 110/70   Pulse 72   Temp (!) 97.2 F (36.2 C) (Temporal)   Ht 5\' 8"  (1.727 m)   Wt 168 lb 12.8 oz (76.6 kg)   LMP 09/01/2012   BMI 25.67 kg/m     Height: 5\' 8"  (172.7 cm)  Ht Readings from Last 3 Encounters:  03/09/19 5\' 8"  (1.727 m)  02/25/19 5\' 8"  (1.727 m)  11/24/17 5\' 8"  (1.727 m)    General appearance: alert, cooperative and appears stated age Head: Normocephalic, without obvious abnormality, atraumatic Neck: no adenopathy, supple, symmetrical, trachea midline and thyroid normal to inspection and palpation Lungs: clear to auscultation bilaterally Breasts: normal appearance, no masses  or tenderness Heart: regular rate and rhythm Abdomen: soft, non-tender; bowel sounds normal; no masses,  no organomegaly Extremities: extremities normal, atraumatic, no cyanosis or edema Skin: Skin color, texture, turgor normal. No rashes or lesions Lymph nodes: Cervical, supraclavicular, and axillary nodes normal. No abnormal inguinal nodes palpated Neurologic: Grossly normal   Pelvic: External genitalia:  no lesions              Urethra:  normal appearing urethra with no masses, tenderness or lesions              Bartholins and Skenes: normal                 Vagina: normal appearing vagina with normal color and discharge, no lesions              Cervix: no lesions              Pap taken: No. Bimanual Exam:  Uterus:  normal size, contour, position, consistency, mobility, non-tender              Adnexa: normal adnexa and no mass, fullness, tenderness               Rectovaginal: Confirms               Anus:  normal sphincter tone, no lesions  Chaperone was present for exam.  A:  Well Woman with normal exam PMP, no HRT H/o depression GERD H/O melanoma followed by Dr. Ledell Peoples PA  P:   Mammogram guidelines reviewed Pap and HR HPV was negative 2019.  Not indicated today. BMD is UTD Colonoscopy is UTD. On HRT with estradiol patch 0.05mg  patches and Provera 2.5mg .  (combipatch is back ordered at this time).  She will call if needs rx sent to mail order or changed to [facility] work it UTD return annually or prn

## 2019-03-09 NOTE — Telephone Encounter (Signed)
Patient sent the following correspondence through [place].  Hi Dr. Sabra Heck.  I'll get my Estradiol filled at the [facility], [place], [place], [place] [id number], with the Good RX coupon for now.  Could you call it in to that store?  Thanks so much.

## 2019-03-10 NOTE — Telephone Encounter (Signed)
Medication refill request: Vivelle Dot  Last AEX:  03/09/19 Next AEX: 06/06/20 Last MMG (if hormonal medication request): 02/12/19 Bi-rads 1 neg  Refill authorized: #[id number] with 3 RF

## 2019-03-15 ENCOUNTER — Telehealth: Payer: Self-pay

## 2019-03-15 NOTE — Telephone Encounter (Signed)
I confirmed pt would like Estradiol sent to [facility] on [address] in [place].

## 2019-03-15 NOTE — Telephone Encounter (Signed)
Traci Welch's suggested to have the patient call [facility] and have them transfer her her prescription to [facility]. This way there will not be 2 open prescriptions at 2 different pharmacies. I called Traci Welch and she said she will call them. Encounter closed.

## 2019-03-15 NOTE — Telephone Encounter (Signed)
Per Traci Welch- Pt needs to call [facility] and have her prescription transferred to [facility]. Pt notified and said she will call them. Encounter closed.

## 2019-03-15 NOTE — Telephone Encounter (Signed)
Message left for pt to call and let us know which type of medication she wants refilled at [facility]. Estradiol or Combi-patch? One was on backorder and we were unsure which one she wanted.

## 2019-03-15 NOTE — Telephone Encounter (Signed)
I confirmed pt would like Estradiol sent to [facility] on [address] in [place].

## 2019-03-16 ENCOUNTER — Other Ambulatory Visit: Payer: Self-pay | Admitting: Family Medicine

## 2019-03-16 DIAGNOSIS — M545 Low back pain, unspecified: Secondary | ICD-10-CM

## 2019-03-16 NOTE — Telephone Encounter (Signed)
[facility] is requesting to fill pt flexeril. Please advise [facility]

## 2019-04-22 ENCOUNTER — Encounter: Payer: Self-pay | Admitting: *Deleted

## 2019-06-23 ENCOUNTER — Other Ambulatory Visit: Payer: Self-pay | Admitting: *Deleted

## 2019-06-23 NOTE — Telephone Encounter (Signed)
Medication refill request: Combipatch  Last AEX:  03-09-2019 SM  Next AEX: 06-06-20 Last MMG (if hormonal medication request): 02-11-2019 density D/BIRADS 1 negative  Refill authorized: Today, please advise.   Spoke with patient. Patient states her insurance changed at the first of the year and now needs to use [facility] for medications. Patient requesting refill of Combipatch to see if [facility] can fill. RN advised would update Dr. Sabra Heck.   Medication pended for #24, 0RF. Please refill if appropriate.

## 2019-06-24 MED ORDER — COMBIPATCH 0.05-0.25 MG/DAY TD PTTW: MEDICATED_PATCH | TRANSDERMAL | 4 refills | Status: DC

## 2019-07-09 ENCOUNTER — Encounter: Payer: Self-pay | Admitting: Family Medicine

## 2019-07-09 MED ORDER — HYDROCORTISONE (PERIANAL) 2.5 % EX CREA: 1.0000 "application " | TOPICAL_CREAM | Freq: Two times a day (BID) | CUTANEOUS | 1 refills | Status: DC | PRN

## 2019-08-31 ENCOUNTER — Encounter: Payer: Self-pay | Admitting: Family Medicine

## 2019-11-02 ENCOUNTER — Telehealth: Payer: Self-pay | Admitting: Obstetrics & Gynecology

## 2019-11-02 ENCOUNTER — Encounter: Payer: Self-pay | Admitting: Obstetrics & Gynecology

## 2019-11-02 DIAGNOSIS — M8580 Other specified disorders of bone density and structure, unspecified site: Secondary | ICD-10-CM

## 2019-11-02 NOTE — Telephone Encounter (Signed)
Thomes Lolling "[place]"  [facility]  Phone Number: [phone]  Hi Dr. Sabra Heck. When I schedule my next breast exam for Sept., Should I also be given another bone density test? I had one 3 years ago, and you said I should get one every 3 years. Thanks.

## 2019-11-02 NOTE — Telephone Encounter (Signed)
Future order for Dexa scan at [place] placed.  Thank you.

## 2019-11-02 NOTE — Telephone Encounter (Signed)
Per review of Epic, last BMD 01/19/18. Mild osteopenia right hip, repeat 3-4 years.   Call to patient, advised as seen above. Due for BMD on or after 01/19/2021. Questions answered. Patient thankful for call.   Routing to provider for final review. Patient is agreeable to disposition. Will close encounter.

## 2019-11-02 NOTE — Addendum Note (Signed)
Addended by: Megan Salon on: 11/02/2019 12:35 PM   Modules accepted: Orders

## 2019-11-19 ENCOUNTER — Other Ambulatory Visit: Payer: Self-pay | Admitting: Obstetrics & Gynecology

## 2019-11-19 DIAGNOSIS — Z1231 Encounter for screening mammogram for malignant neoplasm of breast: Secondary | ICD-10-CM

## 2020-02-15 ENCOUNTER — Other Ambulatory Visit: Payer: Self-pay

## 2020-02-15 ENCOUNTER — Ambulatory Visit
Admission: RE | Admit: 2020-02-15 | Discharge: 2020-02-15 | Disposition: A | Discharge status: Home / Self Care | Payer: Commercial Managed Care - PPO | Source: Ambulatory Visit | Attending: Obstetrics & Gynecology | Admitting: Obstetrics & Gynecology

## 2020-02-15 DIAGNOSIS — Z1231 Encounter for screening mammogram for malignant neoplasm of breast: Secondary | ICD-10-CM

## 2020-02-16 ENCOUNTER — Other Ambulatory Visit: Payer: Self-pay | Admitting: Obstetrics & Gynecology

## 2020-02-16 DIAGNOSIS — N63 Unspecified lump in unspecified breast: Secondary | ICD-10-CM

## 2020-02-27 NOTE — Progress Notes (Signed)
Chief Complaint  Patient presents with  . Depression    nonfasting med check, no new concerns.    Patient presents for med check.  Last seen 1 year ago.  She sees her GYN yearly. She sees dermatologist yearly (h/o melanoma).  She has been following a keto diet since 04/04/2019.  She has lost 22#, along with her husband. She had a lab panel through [facility] for work last month.  Sugar was normal. She will be sending her lab results when she gets home.  Depression: She is currently taking Zoloft 150mg  and doing well as far as depression.  Due for refills. She denies any side effects. She does report having increased anxiety x 8 months, with some panic attacks, feels anxious in lines, prior to book club.  Still sees a therapist, hasn't addressed the anxiety much. Flying next week, asking for medication. (also took Wellbutrin along with zoloft in the past).  H/o ADD: She was started back on Ritalin in 01/2015. She used to take 10mg  most workdays (not year-round, but during busier times of the year). She reported that the medicationwas effective for4 hours, so she would take it around 11 am, just once daily. She had tolerated this well, without side effects--no tics, insomnia. Last written for #60 in 10/2017. She stopped taking it in March 2020, as things weren't very busy and she felt like she was doing fine without it.  She still has a few left, uses it once every other week, as this is "busy season". Declines refill. Since she is working from home, not as stressful, doesn't need it.  GERD: Changed to pantoprazole in 01/2015. At her visit last year she was doing well taking it just every other day. She is currently using it about every 3rd day, usually with spicy foods or if she has more than 1 glass of wine. She denies chest pain, dysphagia.  She has had recurrent symptoms when she had run out in the past.  Vitamin Ddeficiency: Last level was low at 23.7 last year, and she was treated with 8  weeks of prescription weekly replacement, and advised to start taking 2000 IU of OTC D3 daily. She is currently taking 1000 IU plus the D that is with her 1 Calcium tablet daily.  She was noted to have mildly impaired fasting glucose last year, with fasting sugar of 104. She states this was repeated on recent bloodwork from work and was normal (to provide results).  She has h/o elevated TG, normal on last check: Lab Results  Component Value Date   CHOL 199 02/25/2019   HDL 60 02/25/2019   LDLCALC 124 (H) 02/25/2019   TRIG 82 02/25/2019   CHOLHDL 3.3 02/25/2019   Back pain: She has pain when sitting at her computer for a prolonged time.  She tried some lumbar support pillows which didn't help.  Previously used flexeril prn for spasms. Last filled #30 in 03/16/2019. Admits to more chronic pain now, not just with sitting. Does some back exercises, planks. She describes the discomfort as a dull ache. Massages help The pain is in the upper back/neck, and lower back.  Denies any radiation of the pain, numbness, tingling or weakness.  She also has a question/concern about her right breast. She has a diagnostic mammogram scheduled for Friday.  She noted an area at the medial right breast that felt different, a lump, not really sore.  PMH, PSH, SH reviewed and updated.  Immunization History  Administered Date(s) Administered  .  Influenza,inj,Quad PF,6+ Mos 01/19/2015, 04/27/2018, 02/25/2019, 02/28/2020  . PFIZER SARS-COV-2 Vaccination 08/13/2019, 09/03/2019  . Tdap 05/23/2015  . Zoster Recombinat (Shingrix) 10/22/2016, 04/20/2019   Walks 4x/week Floor exercises the other days.  Outpatient Encounter Medications as of 02/28/2020  Medication Sig Note  . calcium carbonate (OS-CAL) 600 MG TABS tablet Take 600 mg by mouth daily with breakfast.   . cholecalciferol (VITAMIN D) 1000 units tablet Take 1,000 Units by mouth daily.   . cyclobenzaprine (FLEXERIL) 10 MG tablet TAKE ONE TABLET BY MOUTH  THREE TIMES A DAY AS NEEDED FOR MUSCLE SPASMS   . doxylamine, Sleep, (UNISOM) 25 MG tablet Take 25 mg by mouth at bedtime as needed.   Marland Kitchen estradiol-norethindrone (COMBIPATCH) 0.05-0.25 MG/DAY APPLY 1 PATCH TWICE WEEKLY   . Multiple Vitamins-Minerals (MULTIVITAMIN ADULT PO) Take 1 tablet by mouth daily.   . pantoprazole (PROTONIX) 40 MG tablet Take 1 tablet (40 mg total) by mouth daily.   . sertraline (ZOLOFT) 100 MG tablet Take 1.5 tablets (150 mg total) by mouth at bedtime.   . [DISCONTINUED] estradiol (VIVELLE-DOT) 0.05 MG/24HR patch Place 1 patch (0.05 mg total) onto the skin 2 (two) times a week.   . [DISCONTINUED] pantoprazole (PROTONIX) 40 MG tablet Take 1 tablet (40 mg total) by mouth daily.   . [DISCONTINUED] sertraline (ZOLOFT) 100 MG tablet Take 1.5 tablets (150 mg total) by mouth at bedtime.   . ALPRAZolam (XANAX) 0.25 MG tablet Take 1-2 tablets (0.25-0.5 mg total) by mouth 3 (three) times daily as needed for anxiety.   . hydrocortisone (ANUSOL-HC) 2.5 % rectal cream Place 1 application rectally 2 (two) times daily as needed for hemorrhoids or anal itching. (Patient not taking: Reported on 02/28/2020)   . methylphenidate (RITALIN) 10 MG tablet 1 tablet by mouth twice daily as directed   . [DISCONTINUED] hydrocortisone (PROCTO-MED HC) 2.5 % rectal cream Place 1 application rectally 2 (two) times daily. 07/09/2019: procto-med not avail  . [DISCONTINUED] medroxyPROGESTERone (PROVERA) 2.5 MG tablet Take 1 tablet (2.5 mg total) by mouth daily.   . [DISCONTINUED] Vitamin D, Ergocalciferol, (DRISDOL) 1.25 MG (50000 UT) CAPS capsule Take 1 capsule (50,000 Units total) by mouth every 7 (seven) days.    No facility-administered encounter medications on file as of 02/28/2020.   Allergies  Allergen Reactions  . Phenergan [Promethazine Hcl] Other (See Comments)    Loss consciousness   ROS: No fever, chills, URI symptoms, cough, shortness of breath, headaches, dizziness, chest pain. Denies  heartburn, dysphagia, bowel changes, urinary complaints or other concerns. No skin complaints/concerns.  Low back and neck pain per HPI.   Denies any stomach problem since being on keto diet. No longer eating any gluten.  No stomach aches or urinary complaints. Moods per HPI Weight loss, intentional. mammo (diagnostic) on Friday--has an area of concern on her right inner breast (nontender, lump/asymmetric)    PHYSICAL EXAM  BP 102/74   Pulse 80   Ht 5\' 8"  (1.727 m)   Wt 146 lb 9.6 oz (66.5 kg)   LMP 09/01/2012   BMI 22.29 kg/m   Wt Readings from Last 3 Encounters:  02/28/20 146 lb 9.6 oz (66.5 kg)  03/09/19 168 lb 12.8 oz (76.6 kg)  02/25/19 167 lb 9.6 oz (76 kg)    Well appearing, pleasant female in no distress HEENT: conjunctiva and sclera are clear, EOMI.  Wearing mask Neck: no lymphadenopathy, thyromegaly or carotid bruit Heart: regular rate and rhythm without murmur Lungs: clear bilaterally Back: no spinal or CVA tenderness.  Nontender at [facility] joints and muscles. Right breast--there is an area at 3:30 that is slightly tender, slight mass, tissue feels c/w fibrocystic, just in an unusual location (medial breast).  No axillary lymphadenopathy, nipple discharge or inversion, or any other masses of the right breast. Abdomen: soft, nontender, no organomegaly or mass Extremities: no edema, 2+ pulses Skin: normal turgor, no rash Psych: normal mood, affect, hygiene and grooming Neuro: alert and oriented, normal gait   PHQ-9 score 1 GAD-7 score of 6  ASSESSMENT/PLAN:  Vitamin D deficiency - check level and adjust supplement recommendations based on results - Plan: VITAMIN D 25 Hydroxy (Vit-D Deficiency, Fractures)  Depression, major, in remission ([place]) - continue zoloft 150mg  daily - Plan: sertraline (ZOLOFT) 100 MG tablet  ADD (attention deficit disorder) without hyperactivity - not currently needing medication.  Will contact us for refill if workload gets more challenging  and if medication is needed  Medication monitoring encounter  Gastroesophageal reflux disease without esophagitis - doing well on PPI q2-3d - Plan: pantoprazole (PROTONIX) 40 MG tablet  Need for influenza vaccination - Plan: Flu Vaccine QUAD 6+ mos PF IM (Fluarix Quad PF)  Anxiety - some social component. Discussed treatment options including propranolol, Buspar, and further working on w/therapist. some techniques reviewed. alprazolam prn - Plan: ALPRAZolam (XANAX) 0.25 MG tablet  Breast mass, right - scheduled for diagnostic mammo later this week. Counseled/reassured (will get results same day)  Chronic bilateral low back pain without sciatica - discussed posture, causes. Cont core strenghtening, also work on stretching.  Heat, massage, stretches. Consider PT if not improving or if worse   Alprazolam prn panic attacks and for flying. Risks/SE reviewed, not to mix with alcohol.  She will discuss with her therapist, and assess when she is feeling anxious, and let us know if interested in starting an additional medication. We did discuss potential interaction (risk for serotonin syndrome) with Buspar, which may limit dose vs need to cut back on zoloft dose.  Since there is an aspect with crowds, social situations, think propranolol would be worth trying, if a medication is needed.  She was given this information and will get back to [place]. Discussed mindfulness, meditation, exercise, taught 4 count breathing technique.  Discussed potential for PT if ongoing problems with neck and back pain.  Recommended [facility] and [facility], where she can self-refer vs [facility] or others where we can refer.  Total visit time was over an hour FTF plus additional time in chart review and documentation.   Addendum: [facility] lab results received from 02/10/20--glu 82. TC 217, TG 69, LDL 132, HDL 69, ratio 3.1.  BP 120/68, waist 32"

## 2020-02-28 ENCOUNTER — Encounter: Payer: Self-pay | Admitting: Family Medicine

## 2020-02-28 ENCOUNTER — Other Ambulatory Visit: Payer: Self-pay

## 2020-02-28 ENCOUNTER — Ambulatory Visit: Payer: Commercial Managed Care - PPO | Admitting: Family Medicine

## 2020-02-28 VITALS — BP 102/74 | HR 80 | Ht 68.0 in | Wt 146.6 lb

## 2020-02-28 DIAGNOSIS — E559 Vitamin D deficiency, unspecified: Secondary | ICD-10-CM | POA: Diagnosis not present

## 2020-02-28 DIAGNOSIS — Z5181 Encounter for therapeutic drug level monitoring: Secondary | ICD-10-CM | POA: Diagnosis not present

## 2020-02-28 DIAGNOSIS — Z23 Encounter for immunization: Secondary | ICD-10-CM | POA: Diagnosis not present

## 2020-02-28 DIAGNOSIS — F988 Other specified behavioral and emotional disorders with onset usually occurring in childhood and adolescence: Secondary | ICD-10-CM

## 2020-02-28 DIAGNOSIS — F325 Major depressive disorder, single episode, in full remission: Secondary | ICD-10-CM

## 2020-02-28 DIAGNOSIS — K219 Gastro-esophageal reflux disease without esophagitis: Secondary | ICD-10-CM

## 2020-02-28 DIAGNOSIS — F419 Anxiety disorder, unspecified: Secondary | ICD-10-CM

## 2020-02-28 DIAGNOSIS — G8929 Other chronic pain: Secondary | ICD-10-CM

## 2020-02-28 DIAGNOSIS — M545 Low back pain: Secondary | ICD-10-CM

## 2020-02-28 DIAGNOSIS — N6310 Unspecified lump in the right breast, unspecified quadrant: Secondary | ICD-10-CM

## 2020-02-28 MED ORDER — SERTRALINE HCL 100 MG PO TABS: 150.0000 mg | ORAL_TABLET | Freq: Every day | ORAL | 3 refills | Status: DC

## 2020-02-28 MED ORDER — PANTOPRAZOLE SODIUM 40 MG PO TBEC: 40.0000 mg | DELAYED_RELEASE_TABLET | Freq: Every day | ORAL | 3 refills | Status: DC

## 2020-02-28 MED ORDER — ALPRAZOLAM 0.25 MG PO TABS: 0.2500 mg | ORAL_TABLET | Freq: Three times a day (TID) | ORAL | 0 refills | Status: DC | PRN

## 2020-02-28 NOTE — Patient Instructions (Addendum)
We briefly mentioned propranolol (beta blocker) for treating anxiety (by blocking the adrenaline-type of surge, which causes the heart racing, sweating) We also mentioned Buspar.  You can use the alprazolam if you have a panic attack that can't control with other measures (grounding and breathing techniques).  Continue your back exercises. We discussed proper posture, use of heat, massage and stretches. Consider physical therapy if your pain persists or worsens. ([facility] and [facility] are two excellent DPT's)  Please send me the copies of your labwork through [facility].   Mindfulness-Based Stress Reduction Mindfulness-based stress reduction (MBSR) is a program that helps people learn to practice mindfulness. Mindfulness is the practice of intentionally paying attention to the present moment. It can be learned and practiced through techniques such as education, breathing exercises, meditation, and yoga. MBSR includes several mindfulness techniques in one program. MBSR works best when you understand the treatment, are willing to try new things, and can commit to spending time practicing what you learn. MBSR training may include learning about:  How your emotions, thoughts, and reactions affect your body.  New ways to respond to things that cause negative thoughts to start (triggers).  How to notice your thoughts and let go of them.  Practicing awareness of everyday things that you normally do without thinking.  The techniques and goals of different types of meditation. What are the benefits of MBSR? MBSR can have many benefits, which include helping you to:  Develop self-awareness. This refers to knowing and understanding yourself.  Learn skills and attitudes that help you to participate in your own health care.  Learn new ways to care for yourself.  Be more accepting about how things are, and let things go.  Be less judgmental and approach things with an open  mind.  Be patient with yourself and trust yourself more. MBSR has also been shown to:  Reduce negative emotions, such as depression and anxiety.  Improve memory and focus.  Change how you sense and approach pain.  Boost your body's ability to fight infections.  Help you connect better with other people.  Improve your sense of well-being. Follow these instructions at home:   Find a local in-person or online MBSR program.  Set aside some time regularly for mindfulness practice.  Find a mindfulness practice that works best for you. This may include one or more of the following: ? Meditation. Meditation involves focusing your mind on a certain thought or activity. ? Breathing awareness exercises. These help you to stay present by focusing on your breath. ? Body scan. For this practice, you lie down and pay attention to each part of your body from head to toe. You can identify tension and soreness and intentionally relax parts of your body. ? Yoga. Yoga involves stretching and breathing, and it can improve your ability to move and be flexible. It can also provide an experience of testing your body's limits, which can help you release stress. ? Mindful eating. This way of eating involves focusing on the taste, texture, color, and smell of each bite of food. Because this slows down eating and helps you feel full sooner, it can be an important part of a weight-loss plan.  Find a podcast or recording that provides guidance for breathing awareness, body scan, or meditation exercises. You can listen to these any time when you have a free moment to rest without distractions.  Follow your treatment plan as told by your health care provider. This may include taking regular medicines  and making changes to your diet or lifestyle as recommended. How to practice mindfulness To do a basic awareness exercise:  Find a comfortable place to sit.  Pay attention to the present moment. Observe your  thoughts, feelings, and surroundings just as they are.  Avoid placing judgment on yourself, your feelings, or your surroundings. Make note of any judgment that comes up, and let it go.  Your mind may wander, and that is okay. Make note of when your thoughts drift, and return your attention to the present moment. To do basic mindfulness meditation:  Find a comfortable place to sit. This may include a stable chair or a firm floor cushion. ? Sit upright with your back straight. Let your arms fall next to your side with your hands resting on your legs. ? If sitting in a chair, rest your feet flat on the floor. ? If sitting on a cushion, cross your legs in front of you.  Keep your head in a neutral position with your chin dropped slightly. Relax your jaw and rest the tip of your tongue on the roof of your mouth. Drop your gaze to the floor. You can close your eyes if you like.  Breathe normally and pay attention to your breath. Feel the air moving in and out of your nose. Feel your belly expanding and relaxing with each breath.  Your mind may wander, and that is okay. Make note of when your thoughts drift, and return your attention to your breath.  Avoid placing judgment on yourself, your feelings, or your surroundings. Make note of any judgment or feelings that come up, let them go, and bring your attention back to your breath.  When you are ready, lift your gaze or open your eyes. Pay attention to how your body feels after the meditation. Where to find more information You can find more information about MBSR from:  Your health care provider.  Community-based meditation centers or programs.  Programs offered near you. Summary  Mindfulness-based stress reduction (MBSR) is a program that teaches you how to intentionally pay attention to the present moment. It is used with other treatments to help you cope better with daily stress, emotions, and pain.  MBSR focuses on developing  self-awareness, which allows you to respond to life stress without judgment or negative emotions.  MBSR programs may involve learning different mindfulness practices, such as breathing exercises, meditation, yoga, body scan, or mindful eating. Find a mindfulness practice that works best for you, and set aside time for it on a regular basis. This information is not intended to replace advice given to you by your health care provider. Make sure you discuss any questions you have with your health care provider. Document Revised: 05/02/2017 Document Reviewed: 09/26/2016 Elsevier Patient Education  [place].

## 2020-02-29 LAB — VITAMIN D 25 HYDROXY (VIT D DEFICIENCY, FRACTURES): Vit D, 25-Hydroxy: 25.3 ng/mL — ABNORMAL LOW (ref 30.0–100.0)

## 2020-03-03 ENCOUNTER — Other Ambulatory Visit: Payer: Self-pay

## 2020-03-03 ENCOUNTER — Ambulatory Visit
Admission: RE | Admit: 2020-03-03 | Discharge: 2020-03-03 | Disposition: A | Discharge status: Home / Self Care | Payer: Commercial Managed Care - PPO | Source: Ambulatory Visit | Attending: Obstetrics & Gynecology | Admitting: Obstetrics & Gynecology

## 2020-03-03 DIAGNOSIS — N63 Unspecified lump in unspecified breast: Secondary | ICD-10-CM

## 2020-03-22 ENCOUNTER — Encounter: Payer: Self-pay | Admitting: Family Medicine

## 2020-03-22 DIAGNOSIS — F419 Anxiety disorder, unspecified: Secondary | ICD-10-CM

## 2020-03-22 MED ORDER — PROPRANOLOL HCL 10 MG PO TABS: ORAL_TABLET | ORAL | 1 refills | Status: DC

## 2020-03-30 ENCOUNTER — Encounter: Payer: Self-pay | Admitting: Family Medicine

## 2020-06-06 ENCOUNTER — Ambulatory Visit: Payer: 59 | Admitting: Obstetrics & Gynecology

## 2020-07-11 ENCOUNTER — Other Ambulatory Visit: Payer: Self-pay | Admitting: Obstetrics & Gynecology

## 2020-07-14 ENCOUNTER — Other Ambulatory Visit: Payer: Self-pay | Admitting: Obstetrics & Gynecology

## 2020-07-31 ENCOUNTER — Encounter (HOSPITAL_BASED_OUTPATIENT_CLINIC_OR_DEPARTMENT_OTHER): Payer: Self-pay

## 2020-08-15 ENCOUNTER — Other Ambulatory Visit: Payer: Self-pay | Admitting: Family Medicine

## 2020-08-15 ENCOUNTER — Encounter: Payer: Self-pay | Admitting: Family Medicine

## 2020-09-05 ENCOUNTER — Encounter (HOSPITAL_BASED_OUTPATIENT_CLINIC_OR_DEPARTMENT_OTHER): Payer: Self-pay | Admitting: Obstetrics & Gynecology

## 2020-09-05 ENCOUNTER — Other Ambulatory Visit: Payer: Self-pay

## 2020-09-05 ENCOUNTER — Other Ambulatory Visit (HOSPITAL_COMMUNITY)
Admission: RE | Admit: 2020-09-05 | Discharge: 2020-09-05 | Disposition: A | Discharge status: Home / Self Care | Payer: Commercial Managed Care - PPO | Source: Ambulatory Visit | Attending: Obstetrics & Gynecology | Admitting: Obstetrics & Gynecology

## 2020-09-05 ENCOUNTER — Ambulatory Visit (INDEPENDENT_AMBULATORY_CARE_PROVIDER_SITE_OTHER): Payer: Commercial Managed Care - PPO | Admitting: Obstetrics & Gynecology

## 2020-09-05 VITALS — BP 122/84 | HR 67 | Ht 67.5 in | Wt 142.0 lb

## 2020-09-05 DIAGNOSIS — Z124 Encounter for screening for malignant neoplasm of cervix: Secondary | ICD-10-CM | POA: Insufficient documentation

## 2020-09-05 DIAGNOSIS — Z8379 Family history of other diseases of the digestive system: Secondary | ICD-10-CM

## 2020-09-05 DIAGNOSIS — Z79890 Hormone replacement therapy (postmenopausal): Secondary | ICD-10-CM

## 2020-09-05 DIAGNOSIS — Z01419 Encounter for gynecological examination (general) (routine) without abnormal findings: Secondary | ICD-10-CM | POA: Diagnosis not present

## 2020-09-05 MED ORDER — COMBIPATCH 0.05-0.25 MG/DAY TD PTTW: 1.0000 | MEDICATED_PATCH | TRANSDERMAL | 3 refills | Status: DC

## 2020-09-05 MED ORDER — COMBIPATCH 0.05-0.25 MG/DAY TD PTTW: 1.0000 | MEDICATED_PATCH | TRANSDERMAL | 4 refills | Status: DC

## 2020-09-05 NOTE — Progress Notes (Signed)
60 y.o. [id number] Married White or Caucasian female here for annual exam.  Continues to have intermittent diarrhea issues.  Has family hx of Crohn's disease and colitis (in two sisters).  Issues have resolved right now.  Does not need appt.  Colonoscopy is due next year.  Denies vaginal bleeding.    Doing keto diet.  Has lost #26 pounds.  Patient's last menstrual period was 09/01/2012.          Sexually active: Yes.    The current method of family planning is post menopausal status.    Exercising: No.  walking, and floor exercises Smoker:  no  Health Maintenance: Pap:  11/2017 History of abnormal Pap:  no MMG:  03/03/2020 Colonoscopy:  05/07/2017 BMD:   8/19, follow up 5 years TDaP:  05/2015 Pneumonia/a vaccine(s):  n/a Shingrix:  completed Hep C testing: 05/2015 Screening Labs: 02/2019, Dr. Tomi Bamberger   reports that she has never smoked. She has never used smokeless tobacco. She reports current alcohol use of about 10.0 standard drinks of alcohol per week. She reports that she does not use drugs.  Past Medical History:  Diagnosis Date  . Hemorrhoids, internal 07/05/15   banding;Dr.Medoff  . Low back pain    chronic "back aches"  . Melanoma ([place]) end of 2014   left leg  . Postmenopausal   . Postpartum depression   . Seasonal allergies   . Yeast infection    history     Past Surgical History:  Procedure Laterality Date  . CESAREAN SECTION    . MELANOMA EXCISION Left    Leg  . NOSE SURGERY    . WISDOM TOOTH EXTRACTION      Current Outpatient Medications  Medication Sig Dispense Refill  . ALPRAZolam (XANAX) 0.25 MG tablet Take 1-2 tablets (0.25-0.5 mg total) by mouth 3 (three) times daily as needed for anxiety. 20 tablet 0  . calcium carbonate (OS-CAL) 600 MG TABS tablet Take 600 mg by mouth daily with breakfast.    . cholecalciferol (VITAMIN D) 1000 units tablet Take 1,000 Units by mouth daily.    . COMBIPATCH 0.05-0.25 MG/DAY APPLY 1 PATCH TWICE WEEKLY 24 patch 1  .  cyclobenzaprine (FLEXERIL) 10 MG tablet TAKE ONE TABLET BY MOUTH THREE TIMES A DAY AS NEEDED FOR MUSCLE SPASMS 30 tablet 0  . doxylamine, Sleep, (UNISOM) 25 MG tablet Take 25 mg by mouth at bedtime as needed.    . hydrocortisone (ANUSOL-HC) 2.5 % rectal cream PLACE 1 APPLICATION RECTALLY TWO TIMES A DAY AS NEEDED FOR HEMORRHOIDS OR ANAL ITCHING 30 g 1  . Multiple Vitamins-Minerals (MULTIVITAMIN ADULT PO) Take 1 tablet by mouth daily.    . pantoprazole (PROTONIX) 40 MG tablet Take 1 tablet (40 mg total) by mouth daily. 90 tablet 3  . propranolol (INDERAL) 10 MG tablet Take 1 tablet by mouth twice daily for anxiety. If blood pressure tolerates, can increase to 20mg  (2 tablets) after 1 week, if needed. 60 tablet 1  . sertraline (ZOLOFT) 100 MG tablet Take 1.5 tablets (150 mg total) by mouth at bedtime. 135 tablet 3  . methylphenidate (RITALIN) 10 MG tablet 1 tablet by mouth twice daily as directed 60 tablet 0   No current facility-administered medications for this visit.    Family History  Problem Relation Age of Onset  . Emphysema Mother   . Hypertension Mother   . Migraines Mother   . Throat cancer Father        nonsmoker, h/o reflux  .  Testicular cancer Father   . Colitis Sister   . Depression Sister   . Depression Daughter   . Depression Son   . Anxiety disorder Son   . Diabetes Neg Hx     Review of Systems  All other systems reviewed and are negative.   Exam:   BP 122/84   Pulse 67   Ht 5' 7.5" (1.715 m)   Wt 142 lb (64.4 kg)   LMP 09/01/2012   BMI 21.91 kg/m   Height: 5' 7.5" (171.5 cm)  General appearance: alert, cooperative and appears stated age Head: Normocephalic, without obvious abnormality, atraumatic Neck: no adenopathy, supple, symmetrical, trachea midline and thyroid normal to inspection and palpation Lungs: clear to auscultation bilaterally Breasts: normal appearance, no masses or tenderness Heart: regular rate and rhythm Abdomen: soft, non-tender; bowel  sounds normal; no masses,  no organomegaly Extremities: extremities normal, atraumatic, no cyanosis or edema Skin: Skin color, texture, turgor normal. No rashes or lesions Lymph nodes: Cervical, supraclavicular, and axillary nodes normal. No abnormal inguinal nodes palpated Neurologic: Grossly normal   Pelvic: External genitalia:  no lesions              Urethra:  normal appearing urethra with no masses, tenderness or lesions              Bartholins and Skenes: normal                 Vagina: normal appearing vagina with normal color and discharge, no lesions              Cervix: no lesions              Pap taken: Yes.   Bimanual Exam:  Uterus:  normal size, contour, position, consistency, mobility, non-tender              Adnexa: normal adnexa and no mass, fullness, tenderness               Rectovaginal: Confirms               Anus:  normal sphincter tone, no lesions  Chaperone, Prince Rome, CMA, was present for exam.  Assessment/Plan: 1. Well woman exam with routine gynecological exam - pap and HR HPV obtained today - MM 03/2020 - colonoscoyp 05/07/17 -BMD 8/19, follow up 5 years - vaccines updated - screening blood work done with Dr. Tomi Bamberger  2. Cervical cancer screening - Cytology - PAP( )  3. Postmenopausal HRT (hormone replacement therapy) - RF for combipatch to pharmacy.  Pt does not want to make any changes this year.  Risks reviewed.  4. Family history of inflammatory bowel disease

## 2020-09-06 DIAGNOSIS — Z8379 Family history of other diseases of the digestive system: Secondary | ICD-10-CM | POA: Insufficient documentation

## 2020-09-07 LAB — CYTOLOGY - PAP
Comment: NEGATIVE
Diagnosis: NEGATIVE
High risk HPV: NEGATIVE

## 2020-09-27 ENCOUNTER — Encounter: Payer: Self-pay | Admitting: Family Medicine

## 2020-11-13 ENCOUNTER — Encounter (HOSPITAL_BASED_OUTPATIENT_CLINIC_OR_DEPARTMENT_OTHER): Payer: Self-pay

## 2020-11-13 ENCOUNTER — Other Ambulatory Visit (HOSPITAL_BASED_OUTPATIENT_CLINIC_OR_DEPARTMENT_OTHER): Payer: Self-pay | Admitting: Obstetrics & Gynecology

## 2020-11-13 DIAGNOSIS — M8580 Other specified disorders of bone density and structure, unspecified site: Secondary | ICD-10-CM

## 2020-11-13 DIAGNOSIS — E2839 Other primary ovarian failure: Secondary | ICD-10-CM

## 2020-11-14 ENCOUNTER — Other Ambulatory Visit: Payer: Self-pay | Admitting: Family Medicine

## 2020-11-14 DIAGNOSIS — Z1231 Encounter for screening mammogram for malignant neoplasm of breast: Secondary | ICD-10-CM

## 2020-12-25 ENCOUNTER — Telehealth: Payer: Self-pay | Admitting: Family Medicine

## 2020-12-25 ENCOUNTER — Encounter: Payer: Self-pay | Admitting: Family Medicine

## 2020-12-25 NOTE — Telephone Encounter (Signed)
[facility] request for propranolol 10 mg please [facility]  Hydrocortisone cream 2.5 cream

## 2020-12-25 NOTE — Telephone Encounter (Signed)
Check with pt if she truly needs these meds.  I don't think she has gotten the propranolol from there before, prev sent to [facility]. She uses this just sporadically.  If she needs a refill of this before her September visit, okay to send in #60, no refills. And okay to RF the [facility] (I believe this should be the rectal cream), if needed. This was also previously sent to [facility], so see if she wants the change in pharmacy, if RF needed.

## 2020-12-25 NOTE — Telephone Encounter (Signed)
Left detailed message asking patient to please call back to let Dr. Tomi Bamberger know if she truly needs these refilled and if there was a pharmacy change to mail order vs [facility].

## 2020-12-26 ENCOUNTER — Other Ambulatory Visit: Payer: Self-pay

## 2020-12-26 DIAGNOSIS — F419 Anxiety disorder, unspecified: Secondary | ICD-10-CM

## 2020-12-26 MED ORDER — HYDROCORTISONE (PERIANAL) 2.5 % EX CREA: TOPICAL_CREAM | CUTANEOUS | 0 refills | Status: DC

## 2020-12-26 MED ORDER — PROPRANOLOL HCL 10 MG PO TABS: ORAL_TABLET | ORAL | 0 refills | Status: DC

## 2021-02-06 NOTE — Progress Notes (Signed)
Chief Complaint  Patient presents with   Depression    Med check, no new concerns.     Patient presents for med check.  Last seen 1 year ago.  She sees her GYN yearly. She sees dermatologist yearly (h/o melanoma).  She has h/o elevated fasting glucose and TG in the past.  She sent a copy of her labs done through [facility] in 09/2020: glu 102, TC 254, LDL 148, HDL 90, TG 65, ratio 2.8. MBI 22, BP 117/75, waist 30" (down 2" from 02/2020) Sugar was noted to be mildly elevated. She follows a very low carb diet (keto), doesn't have much sugar other than wine.   Depression:  She is currently taking Zoloft '150mg'$  and doing well as far as depression.  Due for refills. She denies any side effects.  At her visit last year she reported having increased anxiety x 8 months..  We had discussed Buspar and beta blockers, and in October she was started on Propranolol to use BID prn. She uses it before social interactions, work meetings, takes 1 tablet and it is effective.  No dizziness or side effects. She hasn't been seeing therapist recently, hasn't needed.  Last filled xanax #20 02/2020.  She no longer has any left. Would like some to have for travel/flying. She is going on a [place] cruise ([place] and [place]) next week. She is also requesting patches for sea sickness. Has never used patches before. She did one cruise in the past, and did have some issues. Also gets carsick.  H/o ADD:  She was started back on Ritalin in 01/2015. She used to take '10mg'$  on workdays during busier times of the year (not all the time). It was effective for about 4 hours, and would take it around 11 am, just once daily.  She stopped taking medication in 08/2018 when things weren't busy and she didn't need it.  She reported last year that since she is working from home, she doesn't really finds she needs medication, maybe once every other week during "busy season", and that she still had some left. (Last filled #60 in 10/2017). She will be  going back into the office in October 2 days/week. Still has some left.  Will call if/when she needs more.  GERD:  She is taking Pantoprazole every 2-3 days, when she has spicy foods, or if having more than 1 glass of wine. This is effective for her.  She has had recurrent reflux symptoms when she had run out of medication in the past.  She denies chest pain, dysphagia. No changes in the last year.  Vitamin D deficiency:  Last level was low at 25.3 in 02/2020 (had been 23.7 the prior year).  She had been taking 1000 IU plus the D that is with her 1 Calcium tablet daily at that time.  She was advised to increase to 2000 IU daily (plus her Ca+D).  She increased to 2000 IU, but currently reports that she doesn't take it daily in the summer if in the sun a lot.  She is also taking MVI daily and Calcium with D once daily.  Back (upper back/neck and low back) pain: This used to be only when sitting at computer for a prolonged time, but in the last year or more it has been more chronic (not just with sitting).  She describes the discomfort as a dull ache. Massages help. Denies any radiation of the pain, numbness, tingling or weakness. Last year we discussed PT, recommended [facility], where she can self-refer vs [facility] or others where we can refer. She didn't look into this. Current exercise include walking a lot, does floor exercises (sit ups, weights).  No yoga or pilates No radiation of pain, sciatica, weakness.  Insomnia: using Unisom nightly--stopped it for a while, but had a hard time sleeping without it.   PMH, PSH, SH reviewed and updated.  Immunization History  Administered Date(s) Administered   Influenza,inj,Quad PF,6+ Mos 01/19/2015, 04/27/2018, 02/25/2019, 02/28/2020   PFIZER(Purple Top)SARS-COV-2 Vaccination 08/13/2019, 09/03/2019   Tdap 05/23/2015   Zoster Recombinat (Shingrix) 10/22/2016, 04/20/2019   Outpatient Encounter Medications as of 02/07/2021   Medication Sig Note   calcium carbonate (OS-CAL) 600 MG TABS tablet Take 600 mg by mouth daily with breakfast.    cholecalciferol (VITAMIN D) 1000 units tablet Take 1,000 Units by mouth daily. 02/07/2021: Takes 2000 IU but not daily over the summer   doxylamine, Sleep, (UNISOM) 25 MG tablet Take 25 mg by mouth at bedtime as needed.    estradiol-norethindrone (COMBIPATCH) 0.05-0.25 MG/DAY Place 1 patch onto the skin 2 (two) times a week.    hydrocortisone (ANUSOL-HC) 2.5 % rectal cream PLACE 1 APPLICATION RECTALLY TWO TIMES A DAY AS NEEDED FOR HEMORRHOIDS OR ANAL ITCHING    Multiple Vitamins-Minerals (MULTIVITAMIN ADULT PO) Take 1 tablet by mouth daily.    pantoprazole (PROTONIX) 40 MG tablet Take 1 tablet (40 mg total) by mouth daily.    sertraline (ZOLOFT) 100 MG tablet Take 1.5 tablets (150 mg total) by mouth at bedtime.    ALPRAZolam (XANAX) 0.25 MG tablet Take 1-2 tablets (0.25-0.5 mg total) by mouth 3 (three) times daily as needed for anxiety. (Patient not taking: Reported on 02/07/2021)    cyclobenzaprine (FLEXERIL) 10 MG tablet TAKE ONE TABLET BY MOUTH THREE TIMES A DAY AS NEEDED FOR MUSCLE SPASMS (Patient not taking: Reported on 02/07/2021)    methylphenidate (RITALIN) 10 MG tablet 1 tablet by mouth twice daily as directed (Patient not taking: Reported on 02/07/2021)    propranolol (INDERAL) 10 MG tablet Take 1 tablet by mouth twice daily for anxiety. If blood pressure tolerates, can increase to '20mg'$  (2 tablets) after 1 week, if needed. (Patient not taking: Reported on 02/07/2021)    No facility-administered encounter medications on file as of 02/07/2021.   Allergies  Allergen Reactions   Phenergan [Promethazine Hcl] Other (See Comments)    Loss consciousness    ROS:  No fever, chills, URI symptoms, cough, shortness of breath, headaches, dizziness, chest pain. Denies heartburn, dysphagia, bowel changes, urinary complaints or other concerns. No skin complaints/concerns.  Low back and neck pain  per HPI.   Anxiety improved, mood are good. Some insomnia.    PHYSICAL EXAM  BP 130/84   Pulse 80   Ht 5' 7.5" (1.715 m)   Wt 146 lb 12.8 oz (66.6 kg)   LMP 09/01/2012   BMI 22.65 kg/m   Wt Readings from Last 3 Encounters:  02/07/21 146 lb 12.8 oz (66.6 kg)  09/05/20 142 lb (64.4 kg)  02/28/20 146 lb 9.6 oz (66.5 kg)    Well appearing, pleasant female in no distress HEENT: conjunctiva and sclera are clear, EOMI.  Wearing mask Neck: no lymphadenopathy, thyromegaly or carotid bruit Heart: regular rate and rhythm without murmur Lungs: clear bilaterally Back: no spinal or CVA tenderness. Nontender at [facility] joints and muscles. Some tenderness at parapinous muscles bilaterally, slightly tight. No spasm Abdomen: soft, nontender, no organomegaly or mass Extremities: no edema,  2+ pulses Skin: normal turgor, no rash Psych: normal mood, affect, hygiene and grooming Neuro: alert and oriented, normal gait   Depression screen [facility] 2/9 02/07/2021 02/28/2020 02/28/2020  Decreased Interest 0 0 0  Down, Depressed, Hopeless 0 0 0  PHQ - 2 Score 0 0 0  Altered sleeping 1 1 -  Tired, decreased energy 1 0 -  Change in appetite 0 0 -  Feeling bad or failure about yourself  0 0 -  Trouble concentrating 0 0 -  Moving slowly or fidgety/restless 0 0 -  Suicidal thoughts 0 0 -  PHQ-9 Score 2 1 -  Difficult doing work/chores Not difficult at all - -   GAD 7 : Generalized Anxiety Score 02/07/2021 02/28/2020  Nervous, Anxious, on Edge 1 1  Control/stop worrying 0 0  Worry too much - different things 0 1  Trouble relaxing 1 1  Restless 0 0  Easily annoyed or irritable 1 2  Afraid - awful might happen 0 1  Total GAD 7 Score 3 6  Anxiety Difficulty Not difficult at all -    ASSESSMENT/PLAN:  Vitamin D deficiency - declined level today. Increase to 2000 IU (at least October through March, consider longterm at 2000 IU vs decrease to 1000 IU in summer)  Impaired fasting glucose - reviewed diet.   Expect somewhat higher sugars when not following keto (ie vacation).   Need for influenza vaccination - Plan: Flu Vaccine QUAD 6+ mos PF IM (Fluarix Quad PF)  Anxiety - some social component, and flying. Continue propranolol prn, and xanax for flying. - Plan: ALPRAZolam (XANAX) 0.25 MG tablet  Gastroesophageal reflux disease without esophagitis - doing well on PPI q2-3d. May need daily during trip related to changes in diet and alcohol - Plan: pantoprazole (PROTONIX) 40 MG tablet  Depression, major, in remission ([place]) - continue zoloft '150mg'$  daily - Plan: sertraline (ZOLOFT) 100 MG tablet  History of motion sickness - risks/SE of patches reviewed.  Alternatives (meclizine) also discussed) - Plan: scopolamine (TRANSDERM-SCOP, 1.5 MG,) 1 MG/3DAYS  Flu shot given. New COVID booster recommended.

## 2021-02-07 ENCOUNTER — Ambulatory Visit (INDEPENDENT_AMBULATORY_CARE_PROVIDER_SITE_OTHER): Payer: Commercial Managed Care - PPO | Admitting: Family Medicine

## 2021-02-07 ENCOUNTER — Encounter: Payer: Self-pay | Admitting: Family Medicine

## 2021-02-07 ENCOUNTER — Other Ambulatory Visit: Payer: Self-pay

## 2021-02-07 VITALS — BP 130/84 | HR 80 | Ht 67.5 in | Wt 146.8 lb

## 2021-02-07 DIAGNOSIS — F419 Anxiety disorder, unspecified: Secondary | ICD-10-CM | POA: Diagnosis not present

## 2021-02-07 DIAGNOSIS — Z23 Encounter for immunization: Secondary | ICD-10-CM

## 2021-02-07 DIAGNOSIS — R7301 Impaired fasting glucose: Secondary | ICD-10-CM

## 2021-02-07 DIAGNOSIS — Z87898 Personal history of other specified conditions: Secondary | ICD-10-CM

## 2021-02-07 DIAGNOSIS — K219 Gastro-esophageal reflux disease without esophagitis: Secondary | ICD-10-CM

## 2021-02-07 DIAGNOSIS — F325 Major depressive disorder, single episode, in full remission: Secondary | ICD-10-CM

## 2021-02-07 DIAGNOSIS — E559 Vitamin D deficiency, unspecified: Secondary | ICD-10-CM

## 2021-02-07 MED ORDER — PANTOPRAZOLE SODIUM 40 MG PO TBEC: 40.0000 mg | DELAYED_RELEASE_TABLET | Freq: Every day | ORAL | 3 refills | Status: DC

## 2021-02-07 MED ORDER — SERTRALINE HCL 100 MG PO TABS: 150.0000 mg | ORAL_TABLET | Freq: Every day | ORAL | 3 refills | Status: DC

## 2021-02-07 MED ORDER — SCOPOLAMINE 1 MG/3DAYS TD PT72: 1.0000 | MEDICATED_PATCH | TRANSDERMAL | 0 refills | Status: DC

## 2021-02-07 MED ORDER — ALPRAZOLAM 0.25 MG PO TABS: 0.2500 mg | ORAL_TABLET | Freq: Three times a day (TID) | ORAL | 0 refills | Status: DC | PRN

## 2021-02-07 NOTE — Patient Instructions (Addendum)
Meclizine is an over-the-counter medication that you can use in place of the patches if you find you are having side effects. The meclizine is only used as needed for equilibrium issues. It may be sedating, so you may want to start at the lower dose (12.'5mg'$  Bonine, '25mg'$  is one of the dramamine formulation). Don't use these together.  I recommend getting the new, bivalent COVID booster when available (pay attention to the news, should be soon).  Take your Vitamin D 2000 IU plus your multivitamin every day, especially the months of October through March when we have less sun exposure.  You may be able to cut back to 1000 IU over the summer, but there is no need to cut back (it won't be too much to continue 2000 IU every day, longterm).  Let us know if/when you need Ritalin refill (quantity and which pharmacy).

## 2021-02-13 ENCOUNTER — Encounter: Payer: Self-pay | Admitting: Family Medicine

## 2021-02-13 ENCOUNTER — Other Ambulatory Visit: Payer: Self-pay | Admitting: Family Medicine

## 2021-02-13 MED ORDER — HYDROCORTISONE (PERIANAL) 2.5 % EX CREA: TOPICAL_CREAM | CUTANEOUS | 1 refills | Status: DC

## 2021-04-04 ENCOUNTER — Encounter: Payer: Self-pay | Admitting: Family Medicine

## 2021-04-05 MED ORDER — BIMATOPROST 0.03 % EX SOLN: CUTANEOUS | 5 refills | Status: DC

## 2021-04-05 NOTE — Addendum Note (Signed)
Addended by: Rita Ohara on: 04/05/2021 04:54 PM   Modules accepted: Orders

## 2021-05-02 ENCOUNTER — Other Ambulatory Visit: Payer: Self-pay | Admitting: Obstetrics & Gynecology

## 2021-05-02 ENCOUNTER — Ambulatory Visit
Admission: RE | Admit: 2021-05-02 | Discharge: 2021-05-02 | Disposition: A | Discharge status: Home / Self Care | Payer: Commercial Managed Care - PPO | Source: Ambulatory Visit | Attending: Obstetrics & Gynecology | Admitting: Obstetrics & Gynecology

## 2021-05-02 ENCOUNTER — Ambulatory Visit
Admission: RE | Admit: 2021-05-02 | Discharge: 2021-05-02 | Disposition: A | Discharge status: Home / Self Care | Payer: Commercial Managed Care - PPO | Source: Ambulatory Visit

## 2021-05-02 DIAGNOSIS — Z1231 Encounter for screening mammogram for malignant neoplasm of breast: Secondary | ICD-10-CM

## 2021-05-02 DIAGNOSIS — E2839 Other primary ovarian failure: Secondary | ICD-10-CM

## 2021-05-02 DIAGNOSIS — M8580 Other specified disorders of bone density and structure, unspecified site: Secondary | ICD-10-CM

## 2021-06-12 ENCOUNTER — Encounter: Payer: Self-pay | Admitting: Family Medicine

## 2021-06-12 ENCOUNTER — Other Ambulatory Visit: Payer: Self-pay

## 2021-06-12 DIAGNOSIS — F325 Major depressive disorder, single episode, in full remission: Secondary | ICD-10-CM

## 2021-06-12 MED ORDER — SERTRALINE HCL 100 MG PO TABS: 150.0000 mg | ORAL_TABLET | Freq: Every day | ORAL | 2 refills | Status: DC

## 2021-06-12 NOTE — Telephone Encounter (Signed)
Left pt a voicemail to call back to schedule a CPE

## 2021-06-14 ENCOUNTER — Telehealth: Payer: Commercial Managed Care - PPO | Admitting: Family Medicine

## 2021-06-14 ENCOUNTER — Encounter: Payer: Self-pay | Admitting: Family Medicine

## 2021-06-14 ENCOUNTER — Other Ambulatory Visit: Payer: Self-pay

## 2021-06-14 VITALS — Temp 98.4°F | Ht 67.5 in | Wt 140.0 lb

## 2021-06-14 DIAGNOSIS — J019 Acute sinusitis, unspecified: Secondary | ICD-10-CM

## 2021-06-14 DIAGNOSIS — R051 Acute cough: Secondary | ICD-10-CM

## 2021-06-14 MED ORDER — BENZONATATE 200 MG PO CAPS: 200.0000 mg | ORAL_CAPSULE | Freq: Three times a day (TID) | ORAL | 0 refills | Status: DC | PRN

## 2021-06-14 MED ORDER — AMOXICILLIN-POT CLAVULANATE 875-125 MG PO TABS: 1.0000 | ORAL_TABLET | Freq: Two times a day (BID) | ORAL | 0 refills | Status: DC

## 2021-06-14 NOTE — Patient Instructions (Signed)
°  Stay well hydrated. Continue Mucinex DM (consider 12 hour if available, so that taking it twice daily lasts all day/night). Use the prescribed Tessalon perles 3x/d if needed for cough. You can use this in addition to the medications you are currently taking. You can also add in a decongestant as needed (ie sudafed). Take the Augmentin antibiotic twice daily. Avoid dairy while having diarrhea You may use Imodium as needed for diarrhea (while traveling) Continue regular probiotic use.  I hope you feel better soon! Have fun in [place]!

## 2021-06-14 NOTE — Progress Notes (Signed)
Start time: 9:58 End time: 10:16  Virtual Visit via Video Note  I connected with Traci Welch on 06/14/21 by a video enabled telemedicine application and verified that I am speaking with the correct person using two identifiers.  Location: Patient: home Provider: office   I discussed the limitations of evaluation and management by telemedicine and the availability of in person appointments. The patient expressed understanding and agreed to proceed.  History of Present Illness:  Chief Complaint  Patient presents with   Cough    VIRTUAL cough and drainage x couple weeks. Cannot sleep at night. Last home covid test was 2 days ago and it was negative.   "I basically just have a cold" Cough is keeping her awake at night, not sleeping well.  Started sniffling around Christmas, went through "typical cold stages". Now she has runny nose, mucus is greenish-yellow with some blood. Cough is mostly dry.  30% of the time she can cough up phlegm ('gobs"), doesn't look at the color.  Somewhat thick, slimy. Denies any pain with breathing or shortness of breath. Increased cough with talking/laughing, and also laying down. +headache/face pain, pain on both sides of her nose. Denies pain in teeth. Slight frontal headache also.  She has been taking Mucinex DM (4 hour tablets), also took liquid, but ran out. Also tried Nyquil and Dayquil. Only slight help from these meds.  Negative COVID test 2 days ago.  PMH, PSH, SH reviewed  Outpatient Encounter Medications as of 06/14/2021  Medication Sig Note   bimatoprost (LATISSE) 0.03 % ophthalmic solution Place one drop on applicator and apply evenly along the skin of the upper eyelid at base of eyelashes once daily at bedtime; repeat procedure for second eye (use a clean applicator).    calcium carbonate (OS-CAL) 600 MG TABS tablet Take 600 mg by mouth daily with breakfast.    cholecalciferol (VITAMIN D) 1000 units tablet Take 1,000 Units by mouth  daily.    dextromethorphan-guaiFENesin (MUCINEX DM) 30-600 MG 12hr tablet Take 1 tablet by mouth 2 (two) times daily. 06/14/2021: Last dose was 4am   doxylamine, Sleep, (UNISOM) 25 MG tablet Take 25 mg by mouth at bedtime as needed. 06/14/2021: Took last night   estradiol-norethindrone (COMBIPATCH) 0.05-0.25 MG/DAY Place 1 patch onto the skin 2 (two) times a week.    hydrocortisone (ANUSOL-HC) 2.5 % rectal cream PLACE 1 APPLICATION RECTALLY TWO TIMES A DAY AS NEEDED FOR HEMORRHOIDS OR ANAL ITCHING    Multiple Vitamins-Minerals (MULTIVITAMIN ADULT PO) Take 1 tablet by mouth daily.    Pseudoeph-Doxylamine-DM-APAP (NYQUIL PO) Take 30 mLs by mouth as needed. 06/14/2021: Last dose 1am   sertraline (ZOLOFT) 100 MG tablet Take 1.5 tablets (150 mg total) by mouth at bedtime.    ALPRAZolam (XANAX) 0.25 MG tablet Take 1-2 tablets (0.25-0.5 mg total) by mouth 3 (three) times daily as needed for anxiety. (Patient not taking: Reported on 06/14/2021) 06/14/2021: Uses as need;mostly for flying   cyclobenzaprine (FLEXERIL) 10 MG tablet TAKE ONE TABLET BY MOUTH THREE TIMES A DAY AS NEEDED FOR MUSCLE SPASMS (Patient not taking: Reported on 02/07/2021) 06/14/2021: Has not used recently   methylphenidate (RITALIN) 10 MG tablet 1 tablet by mouth twice daily as directed (Patient not taking: Reported on 02/07/2021)    pantoprazole (PROTONIX) 40 MG tablet Take 1 tablet (40 mg total) by mouth daily. (Patient not taking: Reported on 06/14/2021) 06/14/2021: Not recently   propranolol (INDERAL) 10 MG tablet Take 1 tablet by mouth twice daily for anxiety.  If blood pressure tolerates, can increase to 20mg  (2 tablets) after 1 week, if needed. (Patient not taking: Reported on 02/07/2021) 06/14/2021: As needed   [DISCONTINUED] scopolamine (TRANSDERM-SCOP, 1.5 MG,) 1 MG/3DAYS Place 1 patch (1.5 mg total) onto the skin every 3 (three) days.    No facility-administered encounter medications on file as of 06/14/2021.   Allergies  Allergen Reactions    Phenergan [Promethazine Hcl] Other (See Comments)    Loss consciousness   ROS:  URI symptoms per HPI.  No fever, chills, myalgias. Some diarrhea.  No nausea, vomiting. No changes to abdominal pain ('my whole life")    Observations/Objective:  Temp 98.4 F (36.9 C) (Temporal)    Ht 5' 7.5" (1.715 m)    Wt 140 lb (63.5 kg)    LMP 09/01/2012    BMI 21.60 kg/m   Pleasant female, sounds congested and is coughing periodically during the visit. She is alert, oriented, speaking easily, in no distress. Cranial nerves grossly intact. Exam is limited due to the virtual nature of the visit  Assessment and Plan:  Acute non-recurrent sinusitis, unspecified location - Plan: amoxicillin-clavulanate (AUGMENTIN) 875-125 MG tablet  Acute cough - Plan: benzonatate (TESSALON) 200 MG capsule  Risks/SE of meds reviewed To f/u if symptoms persist or worsen.   Follow Up Instructions:    I discussed the assessment and treatment plan with the patient. The patient was provided an opportunity to ask questions and all were answered. The patient agreed with the plan and demonstrated an understanding of the instructions.   The patient was advised to call back or seek an in-person evaluation if the symptoms worsen or if the condition fails to improve as anticipated.  I spent 20 minutes dedicated to the care of this patient, including pre-visit review of records, face to face time, post-visit ordering of testing and documentation.    Vikki Ports, MD

## 2021-06-17 ENCOUNTER — Other Ambulatory Visit: Payer: Self-pay | Admitting: Family Medicine

## 2021-06-17 DIAGNOSIS — F419 Anxiety disorder, unspecified: Secondary | ICD-10-CM

## 2021-06-18 MED ORDER — PROPRANOLOL HCL 10 MG PO TABS: 10.0000 mg | ORAL_TABLET | Freq: Two times a day (BID) | ORAL | 0 refills | Status: AC | PRN

## 2021-06-27 ENCOUNTER — Encounter: Payer: Self-pay | Admitting: Family Medicine

## 2021-06-27 ENCOUNTER — Other Ambulatory Visit: Payer: Self-pay | Admitting: Family Medicine

## 2021-06-27 ENCOUNTER — Telehealth: Payer: Self-pay

## 2021-06-27 DIAGNOSIS — F419 Anxiety disorder, unspecified: Secondary | ICD-10-CM

## 2021-06-27 NOTE — Telephone Encounter (Signed)
Lvm for pt to call back and let us know if she would like to her [facility]op[facility] of [facility]. Traci Welch

## 2021-06-28 ENCOUNTER — Other Ambulatory Visit: Payer: Self-pay | Admitting: *Deleted

## 2021-07-16 ENCOUNTER — Other Ambulatory Visit: Payer: Self-pay | Admitting: *Deleted

## 2021-07-16 ENCOUNTER — Encounter: Payer: Self-pay | Admitting: Family Medicine

## 2021-07-16 ENCOUNTER — Telehealth: Payer: Self-pay | Admitting: Family Medicine

## 2021-07-16 MED ORDER — HYDROCORTISONE (PERIANAL) 2.5 % EX CREA: TOPICAL_CREAM | CUTANEOUS | 0 refills | Status: DC

## 2021-07-16 NOTE — Telephone Encounter (Signed)
Fax Received Refill request from [facility] for Hydrocortisone Cream 90 day

## 2021-07-16 NOTE — Telephone Encounter (Signed)
Refill sent and patient advised.

## 2021-08-13 ENCOUNTER — Encounter: Payer: Self-pay | Admitting: Family Medicine

## 2021-08-13 ENCOUNTER — Other Ambulatory Visit: Payer: Self-pay | Admitting: *Deleted

## 2021-08-13 DIAGNOSIS — K219 Gastro-esophageal reflux disease without esophagitis: Secondary | ICD-10-CM

## 2021-08-13 MED ORDER — PANTOPRAZOLE SODIUM 40 MG PO TBEC: 40.0000 mg | DELAYED_RELEASE_TABLET | Freq: Every day | ORAL | 1 refills | Status: DC

## 2021-08-28 ENCOUNTER — Other Ambulatory Visit: Payer: Self-pay | Admitting: Family Medicine

## 2021-08-30 NOTE — Telephone Encounter (Signed)
Declined request. ?

## 2021-09-16 ENCOUNTER — Other Ambulatory Visit (HOSPITAL_BASED_OUTPATIENT_CLINIC_OR_DEPARTMENT_OTHER): Payer: Self-pay | Admitting: Obstetrics & Gynecology

## 2021-09-16 DIAGNOSIS — Z79890 Hormone replacement therapy (postmenopausal): Secondary | ICD-10-CM

## 2021-10-04 ENCOUNTER — Encounter: Payer: Self-pay | Admitting: Family Medicine

## 2021-10-08 ENCOUNTER — Encounter (HOSPITAL_BASED_OUTPATIENT_CLINIC_OR_DEPARTMENT_OTHER): Payer: Self-pay | Admitting: Obstetrics & Gynecology

## 2021-11-26 ENCOUNTER — Ambulatory Visit (INDEPENDENT_AMBULATORY_CARE_PROVIDER_SITE_OTHER): Payer: 59 | Admitting: Obstetrics & Gynecology

## 2021-11-26 ENCOUNTER — Encounter (HOSPITAL_BASED_OUTPATIENT_CLINIC_OR_DEPARTMENT_OTHER): Payer: Self-pay | Admitting: Obstetrics & Gynecology

## 2021-11-26 VITALS — BP 132/80 | HR 63 | Ht 68.0 in | Wt 157.2 lb

## 2021-11-26 DIAGNOSIS — Z79890 Hormone replacement therapy (postmenopausal): Secondary | ICD-10-CM | POA: Diagnosis not present

## 2021-11-26 DIAGNOSIS — M8580 Other specified disorders of bone density and structure, unspecified site: Secondary | ICD-10-CM | POA: Diagnosis not present

## 2021-11-26 DIAGNOSIS — Z01419 Encounter for gynecological examination (general) (routine) without abnormal findings: Secondary | ICD-10-CM

## 2021-11-26 MED ORDER — COMBIPATCH 0.05-0.25 MG/DAY TD PTTW: 1.0000 | MEDICATED_PATCH | TRANSDERMAL | 4 refills | Status: DC

## 2021-11-26 NOTE — Progress Notes (Signed)
61 y.o. [id number] Married White or Caucasian female here for annual exam.  Doing well.  Has a trip to [place] planned for the late summer.    Denies vaginal bleeding.    Patient's last menstrual period was 09/01/2012.          Sexually active: Yes.    The current method of family planning is post menopausal status.    Exercising: Yes.    Walking and weight training Smoker:  no  Health Maintenance: Pap:  09/05/2020 Negative History of abnormal Pap:  no MMG:  05/02/2021 Negative Colonoscopy:  05/07/2017, follow up 5 years BMD:   05/02/2021 Mild Osteopenia, -1.3 Screening Labs: has done blood work with Dr. Lynelle Doctor   reports that she has never smoked. She has never used smokeless tobacco. She reports current alcohol use of about 10.0 standard drinks of alcohol per week. She reports that she does not use drugs.  Past Medical History:  Diagnosis Date   Hemorrhoids, internal 07/05/15   banding;Dr.Medoff   Low back pain    chronic "back aches"   Melanoma (HCC) end of 2014   left leg   Postmenopausal    Postpartum depression    Seasonal allergies    Yeast infection    history     Past Surgical History:  Procedure Laterality Date   CESAREAN SECTION     MELANOMA EXCISION Left    Leg   NOSE SURGERY     WISDOM TOOTH EXTRACTION      Current Outpatient Medications  Medication Sig Dispense Refill   ALPRAZolam (XANAX) 0.25 MG tablet Take 1-2 tablets (0.25-0.5 mg total) by mouth 3 (three) times daily as needed for anxiety. 20 tablet 0   bimatoprost (LATISSE) 0.03 % ophthalmic solution Place one drop on applicator and apply evenly along the skin of the upper eyelid at base of eyelashes once daily at bedtime; repeat procedure for second eye (use a clean applicator). 3 mL 5   calcium carbonate (OS-CAL) 600 MG TABS tablet Take 600 mg by mouth daily with breakfast.     cholecalciferol (VITAMIN D) 1000 units tablet Take 1,000 Units by mouth daily.     cyclobenzaprine (FLEXERIL) 10 MG tablet TAKE  ONE TABLET BY MOUTH THREE TIMES A DAY AS NEEDED FOR MUSCLE SPASMS 30 tablet 0   doxylamine, Sleep, (UNISOM) 25 MG tablet Take 25 mg by mouth at bedtime as needed.     hydrocortisone (ANUSOL-HC) 2.5 % rectal cream PLACE 1 APPLICATION RECTALLY TWO TIMES A DAY AS NEEDED FOR HEMORRHOIDS OR ANAL ITCHING 90 g 0   Multiple Vitamins-Minerals (MULTIVITAMIN ADULT PO) Take 1 tablet by mouth daily.     pantoprazole (PROTONIX) 40 MG tablet Take 1 tablet (40 mg total) by mouth daily. 90 tablet 1   propranolol (INDERAL) 10 MG tablet Take 1 tablet (10 mg total) by mouth 2 (two) times daily as needed (anxiety). 90 tablet 0   sertraline (ZOLOFT) 100 MG tablet Take 1.5 tablets (150 mg total) by mouth at bedtime. 135 tablet 2   estradiol-norethindrone (COMBIPATCH) 0.05-0.25 MG/DAY Place 1 patch onto the skin 2 (two) times a week. 24 patch 4   No current facility-administered medications for this visit.    Family History  Problem Relation Age of Onset   Emphysema Mother    Hypertension Mother    Migraines Mother    Throat cancer Father        nonsmoker, h/o reflux   Testicular cancer Father    Colitis Sister  Depression Sister    Depression Daughter    Depression Son    Anxiety disorder Son    Diabetes Neg Hx    Gyn: Genitourinary:negative  Exam:   BP 132/80 (BP Location: Right Arm, Patient Position: Sitting, Cuff Size: Large)   Pulse 63   Ht 5\' 8"  (1.727 m) Comment: reported  Wt 157 lb 3.2 oz (71.3 kg)   LMP 09/01/2012   BMI 23.90 kg/m   Height: 5\' 8"  (172.7 cm) (reported)  General appearance: alert, cooperative and appears stated age Head: Normocephalic, without obvious abnormality, atraumatic Neck: no adenopathy, supple, symmetrical, trachea midline and thyroid normal to inspection and palpation Lungs: clear to auscultation bilaterally Breasts: normal appearance, no masses or tenderness Heart: regular rate and rhythm Abdomen: soft, non-tender; bowel sounds normal; no masses,  no  organomegaly Extremities: extremities normal, atraumatic, no cyanosis or edema Skin: Skin color, texture, turgor normal. No rashes or lesions Lymph nodes: Cervical, supraclavicular, and axillary nodes normal. No abnormal inguinal nodes palpated Neurologic: Grossly normal   Pelvic: External genitalia:  no lesions              Urethra:  normal appearing urethra with no masses, tenderness or lesions              Bartholins and Skenes: normal                 Vagina: normal appearing vagina with normal color and no discharge, no lesions              Cervix: no lesions              Pap taken: No. Bimanual Exam:  Uterus:  normal size, contour, position, consistency, mobility, non-tender              Adnexa: normal adnexa and no mass, fullness, tenderness               Rectovaginal: Confirms               Anus:  normal sphincter tone, no lesions  Chaperone, Ina Homes, CMA, was present for exam.  Assessment/Plan: 1. Well woman exam with routine gynecological exam - pap neg with neg HR HPV 09/05/2020 - mammogram 05/02/2021 - colonoscopy 12/18, follow 5 years - BMD 04/2021 - vaccines reviewed  2. Postmenopausal HRT (hormone replacement therapy) - estradiol-norethindrone (COMBIPATCH) 0.05-0.25 MG/DAY; Place 1 patch onto the skin 2 (two) times a week.  Dispense: 24 patch; Refill: 4  3. Osteopenia, unspecified location

## 2021-12-04 ENCOUNTER — Other Ambulatory Visit: Payer: Self-pay | Admitting: Family Medicine

## 2021-12-05 ENCOUNTER — Encounter: Payer: Self-pay | Admitting: Family Medicine

## 2021-12-05 NOTE — Telephone Encounter (Signed)
Left message asking patient to please call back.

## 2021-12-05 NOTE — Telephone Encounter (Signed)
Is this is okay?

## 2021-12-05 NOTE — Telephone Encounter (Signed)
See if needed, and if so, only 1 add'l RF. Hasn't been seen since we filled this for her in November. Last visit 02/2021, no visits scheduled.   She sees GYN, so we haven't done CPE's, but needs yearly med checks for all of her rx's.  Due in early September.  Please schedule.  Will do further refills at visit. (Should be a fasting med check)

## 2021-12-17 ENCOUNTER — Encounter: Payer: Self-pay | Admitting: Family Medicine

## 2021-12-17 DIAGNOSIS — H903 Sensorineural hearing loss, bilateral: Secondary | ICD-10-CM | POA: Insufficient documentation

## 2021-12-17 DIAGNOSIS — H9313 Tinnitus, bilateral: Secondary | ICD-10-CM | POA: Insufficient documentation

## 2021-12-20 ENCOUNTER — Encounter (HOSPITAL_BASED_OUTPATIENT_CLINIC_OR_DEPARTMENT_OTHER): Payer: Self-pay | Admitting: Obstetrics & Gynecology

## 2021-12-20 ENCOUNTER — Encounter: Payer: Self-pay | Admitting: Family Medicine

## 2021-12-20 ENCOUNTER — Other Ambulatory Visit: Payer: Self-pay | Admitting: Family Medicine

## 2021-12-20 DIAGNOSIS — M545 Low back pain, unspecified: Secondary | ICD-10-CM

## 2021-12-21 MED ORDER — BIMATOPROST 0.03 % EX SOLN: CUTANEOUS | 1 refills | Status: DC

## 2021-12-21 MED ORDER — CYCLOBENZAPRINE HCL 10 MG PO TABS: 5.0000 mg | ORAL_TABLET | Freq: Three times a day (TID) | ORAL | 0 refills | Status: DC | PRN

## 2021-12-21 MED ORDER — HYDROCORTISONE (PERIANAL) 2.5 % EX CREA: TOPICAL_CREAM | CUTANEOUS | 0 refills | Status: DC

## 2021-12-31 ENCOUNTER — Other Ambulatory Visit: Payer: Self-pay | Admitting: Obstetrics & Gynecology

## 2021-12-31 ENCOUNTER — Encounter: Payer: Self-pay | Admitting: Family Medicine

## 2021-12-31 DIAGNOSIS — Z1231 Encounter for screening mammogram for malignant neoplasm of breast: Secondary | ICD-10-CM

## 2022-01-14 ENCOUNTER — Encounter: Payer: Self-pay | Admitting: Family Medicine

## 2022-01-14 DIAGNOSIS — F419 Anxiety disorder, unspecified: Secondary | ICD-10-CM

## 2022-01-14 MED ORDER — ALPRAZOLAM 0.25 MG PO TABS: 0.2500 mg | ORAL_TABLET | Freq: Three times a day (TID) | ORAL | 0 refills | Status: DC | PRN

## 2022-01-15 ENCOUNTER — Encounter (HOSPITAL_BASED_OUTPATIENT_CLINIC_OR_DEPARTMENT_OTHER): Payer: Self-pay | Admitting: Obstetrics & Gynecology

## 2022-01-17 ENCOUNTER — Other Ambulatory Visit (HOSPITAL_BASED_OUTPATIENT_CLINIC_OR_DEPARTMENT_OTHER): Payer: Self-pay | Admitting: Obstetrics & Gynecology

## 2022-01-17 DIAGNOSIS — Z79890 Hormone replacement therapy (postmenopausal): Secondary | ICD-10-CM

## 2022-01-17 MED ORDER — ESTRADIOL 0.5 MG PO TABS: 0.5000 mg | ORAL_TABLET | Freq: Every day | ORAL | 3 refills | Status: DC

## 2022-01-17 MED ORDER — NORETHINDRONE ACETATE 5 MG PO TABS: 2.5000 mg | ORAL_TABLET | Freq: Every day | ORAL | 3 refills | Status: DC

## 2022-01-18 ENCOUNTER — Other Ambulatory Visit (HOSPITAL_BASED_OUTPATIENT_CLINIC_OR_DEPARTMENT_OTHER): Payer: Self-pay | Admitting: Obstetrics & Gynecology

## 2022-01-18 DIAGNOSIS — Z79890 Hormone replacement therapy (postmenopausal): Secondary | ICD-10-CM

## 2022-01-18 MED ORDER — NORETHINDRONE ACETATE 5 MG PO TABS: 2.5000 mg | ORAL_TABLET | Freq: Every day | ORAL | 3 refills | Status: DC

## 2022-02-06 ENCOUNTER — Encounter: Payer: Self-pay | Admitting: Internal Medicine

## 2022-02-08 ENCOUNTER — Other Ambulatory Visit: Payer: Self-pay | Admitting: Family Medicine

## 2022-02-08 NOTE — Telephone Encounter (Signed)
I'm pretty sure she is in [place] right now. This can wait for her appointment

## 2022-02-18 ENCOUNTER — Encounter: Payer: Self-pay | Admitting: Family Medicine

## 2022-02-19 NOTE — Progress Notes (Unsigned)
No chief complaint on file.   Patient presents for follow-up on chronic issues.  She doesn't get CPE's here, sees GYN regularly, and dermatologist regularly. Last seen in office a year ago (had one video visit for illness, sinusitis, in 06/2021).  She saw ENT in July for tinnitus, hearing loss. She saw the PA, [place].  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  They discussed hearing protection, masking techniques, and consider trial of intranasal steroid spray (?poss component of tinnitus from ETD).  She has h/o elevated fasting glucose and TG in the past.  Last labs we have were from 09/2020: glu 102, TC 254, LDL 148, HDL 90, TG 65, ratio 2.8. MBI 22, BP 117/75, waist 30" (down 2" from 02/2020) She follows a very low carb diet (keto), doesn't have much sugar other than wine.   UPDATE IF ANY f/u LABS   Depression and anxiety:  She is currently taking Zoloft $RemoveBefor'150mg'uvWMqDILlBUP$  and doing well as far as depression.  Due for refills. She denies any side effects.  She has Propranolol to use BID prn, which she uses before social interactions, work meetings.  No dizziness or side effects. She no longer sees a therapist. UPDATE   Last filled xanax #20 01/14/2022, uses for travel/flying. She recently traveled to [place] (and got COVID while there).   H/o ADD:  She was started back on Ritalin in 01/2015. She used to take $Remov'10mg'DaTnQj$  on workdays during busier times of the year (not all the time). It was effective for about 4 hours, and would take it around 11 am, just once daily.  She stopped taking medication in 08/2018 when things weren't busy and she didn't need it.   She reported last year that she didn't need the medication much when working from home, maybe once every other week during "busy season".  She has since gone back to the office 2 days/week. STILL USING?  NEEDING RF?  (Last filled #60 in 10/2017).    GERD:  She is taking Pantoprazole every 2-3 days, when she has spicy foods, or if having  more than 1 glass of wine. This is effective for her.  She has had recurrent reflux symptoms when she had run out of medication in the past.  She denies chest pain, dysphagia. No changes in the last year.   Vitamin D deficiency:  Last level was low at 25.3 in 02/2020 (had been 23.7 the prior year).  She had been taking 1000 IU plus the D that is with her 1 Calcium tablet daily at that time. She was advised to increase to 2000 IU daily (plus her Ca+D).  She increased to 2000 IU, but last year reported not taking it daily in the summer if in the sun a lot.  She is also taking MVI daily and Calcium with D once daily. RECHECKED SINCE?   Back (upper back/neck and low back) pain: This used to be only when sitting at computer for a prolonged time, but then became more chronic (not just with sitting).  She describes the discomfort as a dull ache. Massages help. Denies any radiation of the pain, numbness, tingling or weakness. Flexeril was refilled 7/21 for prn use.  We previously discussed PT, never got (rec [facility] and [facility] [facility]) Standing desk?    Insomnia: using Unisom nightly??  Immunization History  Administered Date(s) Administered   Influenza,inj,Quad PF,6+ Mos 01/19/2015, 04/27/2018, 02/25/2019, 02/28/2020, 02/07/2021   PFIZER(Purple Top)SARS-COV-2 Vaccination 08/13/2019, 09/03/2019, 04/06/2020, 11/01/2020   Tdap  05/23/2015   Zoster Recombinat (Shingrix) 10/22/2016, 04/20/2019    PMH, PSH, SH reviewed    ROS:  No fever, chills, URI symptoms, cough, shortness of breath, headaches, dizziness, chest pain. Denies heartburn, dysphagia, bowel changes, urinary complaints or other concerns. No skin complaints/concerns.  Low back and neck pain per HPI.   Anxiety improved, mood are good. Some insomnia.   PHYSICAL EXAM:  LMP 09/01/2012   Wt Readings from Last 3 Encounters:  11/26/21 157 lb 3.2 oz (71.3 kg)  06/14/21 140 lb (63.5 kg)  02/07/21 146 lb 12.8 oz (66.6 kg)   Well  appearing, pleasant female in no distress HEENT: conjunctiva and sclera are clear, EOMI.   Neck: no lymphadenopathy, thyromegaly or carotid bruit Heart: regular rate and rhythm without murmur Lungs: clear bilaterally Back: no spinal or CVA tenderness. Nontender at [facility] joints and muscles. Some tenderness at parapinous muscles bilaterally, slightly tight. No spasm Abdomen: soft, nontender, no organomegaly or mass Extremities: no edema, 2+ pulses Skin: normal turgor, no rash Psych: normal mood, affect, hygiene and grooming Neuro: alert and oriented, normal gait  ***UPDATE BACK  ASSESSMENT/PLAN:  Flu shot Phq-9 (f/u depression)   Did she have any labs through [facility] this past year?  She sends too many message for me to look through to see if she attached, as she did in 09/2020. If she hasn't had any labs, is she fasting?? She likely can access them from her phone, if she had them,  I would need to know results (vs check labs again) (Dyslipidemia and IFG)   Pt is 25, RSV likely not yet covered, optional if she wants (not high risk) COVID booster when available  Consider c-met, cbc, D, lipids TSH if sx HIV care gap A1c if not fasting and no labs done in the last year

## 2022-02-20 ENCOUNTER — Encounter: Payer: Self-pay | Admitting: Family Medicine

## 2022-02-20 ENCOUNTER — Ambulatory Visit (INDEPENDENT_AMBULATORY_CARE_PROVIDER_SITE_OTHER): Payer: 59 | Admitting: Family Medicine

## 2022-02-20 VITALS — BP 118/68 | HR 76 | Ht 68.0 in | Wt 158.0 lb

## 2022-02-20 DIAGNOSIS — Z7185 Encounter for immunization safety counseling: Secondary | ICD-10-CM

## 2022-02-20 DIAGNOSIS — G8929 Other chronic pain: Secondary | ICD-10-CM

## 2022-02-20 DIAGNOSIS — E559 Vitamin D deficiency, unspecified: Secondary | ICD-10-CM | POA: Diagnosis not present

## 2022-02-20 DIAGNOSIS — M545 Low back pain, unspecified: Secondary | ICD-10-CM | POA: Diagnosis not present

## 2022-02-20 DIAGNOSIS — F325 Major depressive disorder, single episode, in full remission: Secondary | ICD-10-CM

## 2022-02-20 DIAGNOSIS — F419 Anxiety disorder, unspecified: Secondary | ICD-10-CM | POA: Diagnosis not present

## 2022-02-20 DIAGNOSIS — K219 Gastro-esophageal reflux disease without esophagitis: Secondary | ICD-10-CM

## 2022-02-20 DIAGNOSIS — R7301 Impaired fasting glucose: Secondary | ICD-10-CM | POA: Diagnosis not present

## 2022-02-20 DIAGNOSIS — Z23 Encounter for immunization: Secondary | ICD-10-CM

## 2022-02-20 DIAGNOSIS — E7800 Pure hypercholesterolemia, unspecified: Secondary | ICD-10-CM

## 2022-02-20 DIAGNOSIS — Z5181 Encounter for therapeutic drug level monitoring: Secondary | ICD-10-CM

## 2022-02-20 DIAGNOSIS — F988 Other specified behavioral and emotional disorders with onset usually occurring in childhood and adolescence: Secondary | ICD-10-CM

## 2022-02-20 MED ORDER — PANTOPRAZOLE SODIUM 40 MG PO TBEC: 40.0000 mg | DELAYED_RELEASE_TABLET | Freq: Every day | ORAL | 3 refills | Status: DC

## 2022-02-20 MED ORDER — SERTRALINE HCL 100 MG PO TABS: 150.0000 mg | ORAL_TABLET | Freq: Every day | ORAL | 3 refills | Status: DC

## 2022-02-20 MED ORDER — METHYLPHENIDATE HCL 10 MG PO TABS: 10.0000 mg | ORAL_TABLET | Freq: Two times a day (BID) | ORAL | 0 refills | Status: DC

## 2022-02-20 NOTE — Patient Instructions (Addendum)
You can delay your COVID booster due to recent illness. Minimum wait is a month, likely fine to wait 3 months or more before getting.  You can consider the new RSV vaccine (if insurance covers).  Try and be more proactive with your neck--do the stretching exercises at least once or twice daily.  Heat and massage can also help. Consider Physical Therapy if you have more persistent pain.

## 2022-02-21 LAB — LIPID PANEL
Chol/HDL Ratio: 3.8 ratio (ref 0.0–4.4)
Cholesterol, Total: 210 mg/dL — ABNORMAL HIGH (ref 100–199)
HDL: 56 mg/dL (ref >39)
LDL Chol Calc (NIH): 139 mg/dL — ABNORMAL HIGH (ref 0–99)
Triglycerides: 82 mg/dL (ref 0–149)
VLDL Cholesterol Cal: 15 mg/dL (ref 5–40)

## 2022-02-21 LAB — CBC WITH DIFFERENTIAL/PLATELET
Basophils Absolute: 0 10*3/uL (ref 0.0–0.2)
Basos: 1 %
EOS (ABSOLUTE): 0 10*3/uL (ref 0.0–0.4)
Eos: 1 %
Hematocrit: 42.6 % (ref 34.0–46.6)
Hemoglobin: 14.2 g/dL (ref 11.1–15.9)
Immature Grans (Abs): 0 10*3/uL (ref 0.0–0.1)
Immature Granulocytes: 1 %
Lymphocytes Absolute: 1.7 10*3/uL (ref 0.7–3.1)
Lymphs: 30 %
MCH: 31.4 pg (ref 26.6–33.0)
MCHC: 33.3 g/dL (ref 31.5–35.7)
MCV: 94 fL (ref 79–97)
Monocytes Absolute: 0.5 10*3/uL (ref 0.1–0.9)
Monocytes: 8 %
Neutrophils Absolute: 3.4 10*3/uL (ref 1.4–7.0)
Neutrophils: 59 %
Platelets: 334 10*3/uL (ref 150–450)
RBC: 4.52 x10E6/uL (ref 3.77–5.28)
RDW: 11.6 % — ABNORMAL LOW (ref 11.7–15.4)
WBC: 5.7 10*3/uL (ref 3.4–10.8)

## 2022-02-21 LAB — COMPREHENSIVE METABOLIC PANEL
ALT: 11 IU/L (ref 0–32)
AST: 14 IU/L (ref 0–40)
Albumin/Globulin Ratio: 2 (ref 1.2–2.2)
Albumin: 4.5 g/dL (ref 3.8–4.9)
Alkaline Phosphatase: 61 IU/L (ref 44–121)
BUN/Creatinine Ratio: 14 (ref 12–28)
BUN: 11 mg/dL (ref 8–27)
Bilirubin Total: 0.5 mg/dL (ref 0.0–1.2)
CO2: 19 mmol/L — ABNORMAL LOW (ref 20–29)
Calcium: 9.2 mg/dL (ref 8.7–10.3)
Chloride: 106 mmol/L (ref 96–106)
Creatinine, Ser: 0.77 mg/dL (ref 0.57–1.00)
Globulin, Total: 2.2 g/dL (ref 1.5–4.5)
Glucose: 96 mg/dL (ref 70–99)
Potassium: 5.2 mmol/L (ref 3.5–5.2)
Sodium: 140 mmol/L (ref 134–144)
Total Protein: 6.7 g/dL (ref 6.0–8.5)
eGFR: 88 mL/min/{1.73_m2} (ref >59)

## 2022-02-21 LAB — HEMOGLOBIN A1C
Est. average glucose Bld gHb Est-mCnc: 105 mg/dL
Hgb A1c MFr Bld: 5.3 % (ref 4.8–5.6)

## 2022-02-21 LAB — VITAMIN D 25 HYDROXY (VIT D DEFICIENCY, FRACTURES): Vit D, 25-Hydroxy: 78.8 ng/mL (ref 30.0–100.0)

## 2022-03-12 ENCOUNTER — Encounter: Payer: Self-pay | Admitting: Internal Medicine

## 2022-04-07 ENCOUNTER — Encounter: Payer: Self-pay | Admitting: Family Medicine

## 2022-04-09 ENCOUNTER — Encounter: Payer: Self-pay | Admitting: Family Medicine

## 2022-04-17 ENCOUNTER — Encounter: Payer: Self-pay | Admitting: Family Medicine

## 2022-05-07 ENCOUNTER — Ambulatory Visit
Admission: RE | Admit: 2022-05-07 | Discharge: 2022-05-07 | Disposition: A | Discharge status: Home / Self Care | Payer: 59 | Source: Ambulatory Visit | Attending: Obstetrics & Gynecology | Admitting: Obstetrics & Gynecology

## 2022-05-07 DIAGNOSIS — Z1231 Encounter for screening mammogram for malignant neoplasm of breast: Secondary | ICD-10-CM

## 2022-05-08 LAB — HM COLONOSCOPY

## 2022-05-09 ENCOUNTER — Encounter: Payer: Self-pay | Admitting: *Deleted

## 2022-05-16 ENCOUNTER — Encounter: Payer: Self-pay | Admitting: *Deleted

## 2022-05-22 ENCOUNTER — Other Ambulatory Visit: Payer: Self-pay | Admitting: *Deleted

## 2022-05-22 ENCOUNTER — Encounter: Payer: Self-pay | Admitting: Family Medicine

## 2022-05-22 MED ORDER — HYDROCORTISONE (PERIANAL) 2.5 % EX CREA: TOPICAL_CREAM | CUTANEOUS | 0 refills | Status: DC

## 2022-06-17 ENCOUNTER — Other Ambulatory Visit: Payer: Self-pay | Admitting: Family Medicine

## 2022-06-18 NOTE — Telephone Encounter (Signed)
Is this okay to refill>? 

## 2022-07-12 ENCOUNTER — Other Ambulatory Visit: Payer: Self-pay | Admitting: Family Medicine

## 2022-07-12 NOTE — Telephone Encounter (Signed)
Left message to see if she needed a refill on this

## 2022-08-09 ENCOUNTER — Encounter: Payer: Self-pay | Admitting: Family Medicine

## 2022-08-09 ENCOUNTER — Other Ambulatory Visit: Payer: Self-pay | Admitting: Family Medicine

## 2022-08-09 NOTE — Telephone Encounter (Signed)
I am okay with you refilling this, if the patient truly needs a refill.  I don't know how [employer] works, to know if she truly requested it. If she requested it, she can have a refill

## 2022-08-11 IMAGING — MG MM DIGITAL SCREENING BILAT W/ TOMO AND CAD
8 series · 9 of 24 positions shown · non-contrast
Comparison: Previous exam(s).

CLINICAL DATA: Screening.

EXAM:
DIGITAL SCREENING BILATERAL MAMMOGRAM WITH TOMOSYNTHESIS AND CAD
TECHNIQUE: Bilateral screening digital craniocaudal and mediolateral oblique
mammograms were obtained. Bilateral screening digital breast
tomosynthesis was performed. The images were evaluated with
computer-aided detection.

[R MLO synth-2D]
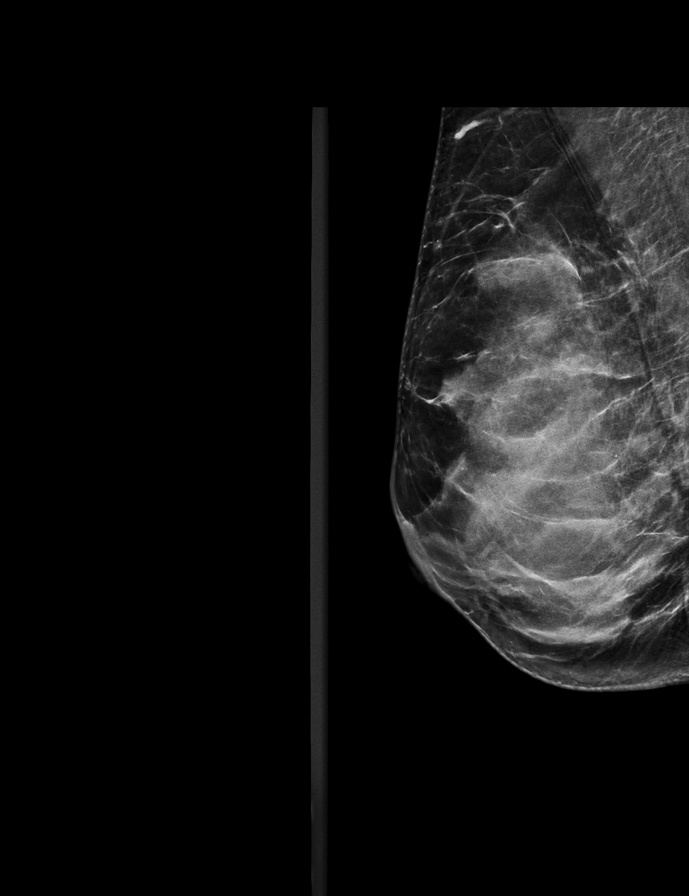

[L CC synth-2D]
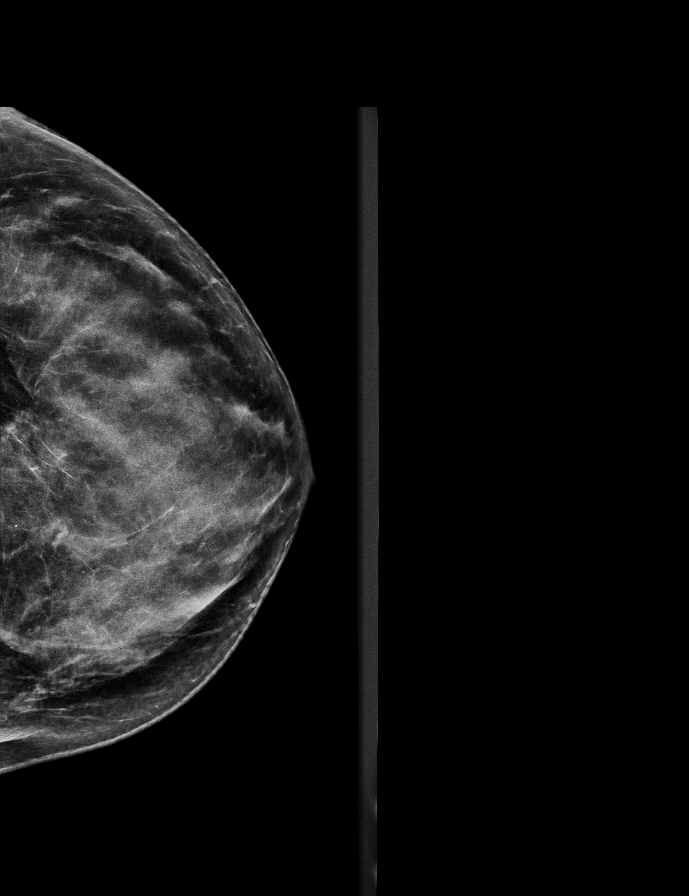

[L MLO synth-2D]
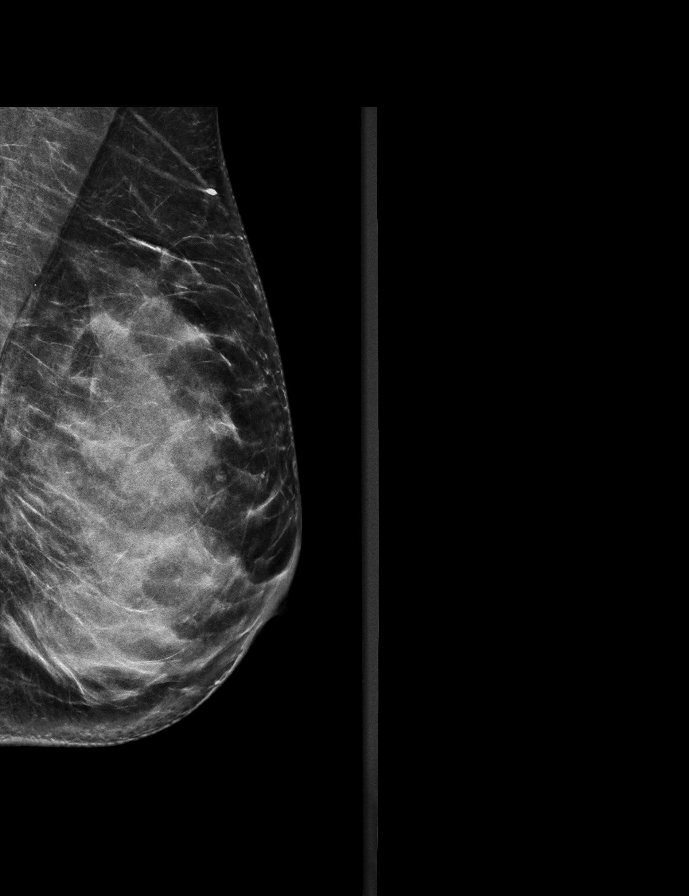

[R CC synth-2D]
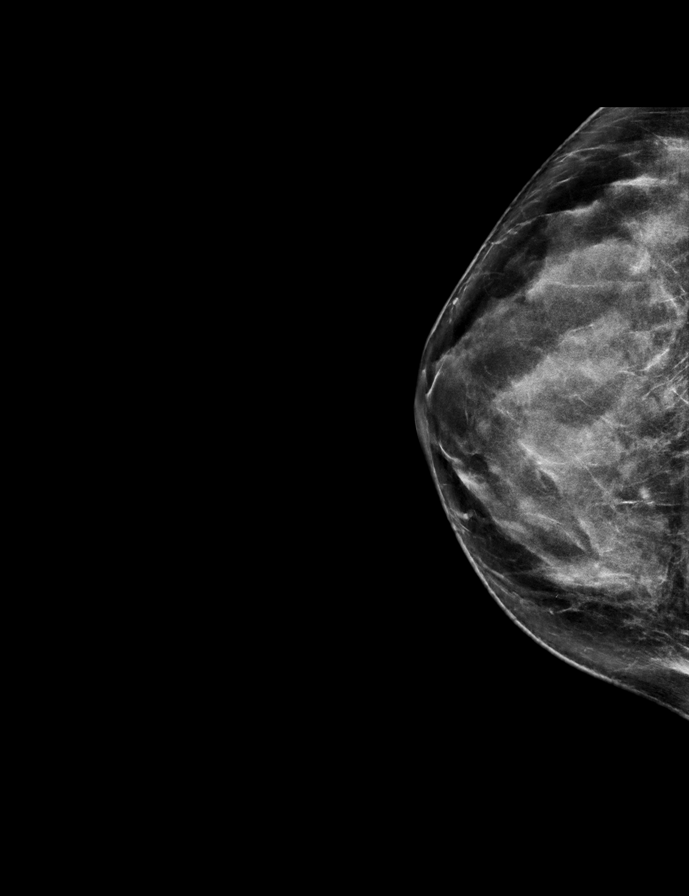

[L MLO tomo · 2 of 61 frames shown]
[frame 20/61]
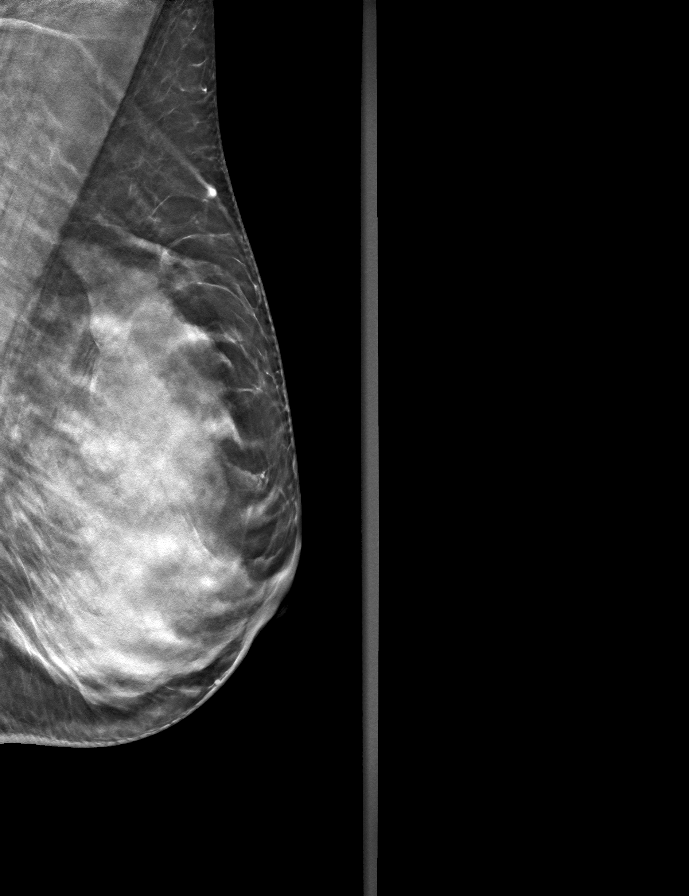
[frame 31/61]
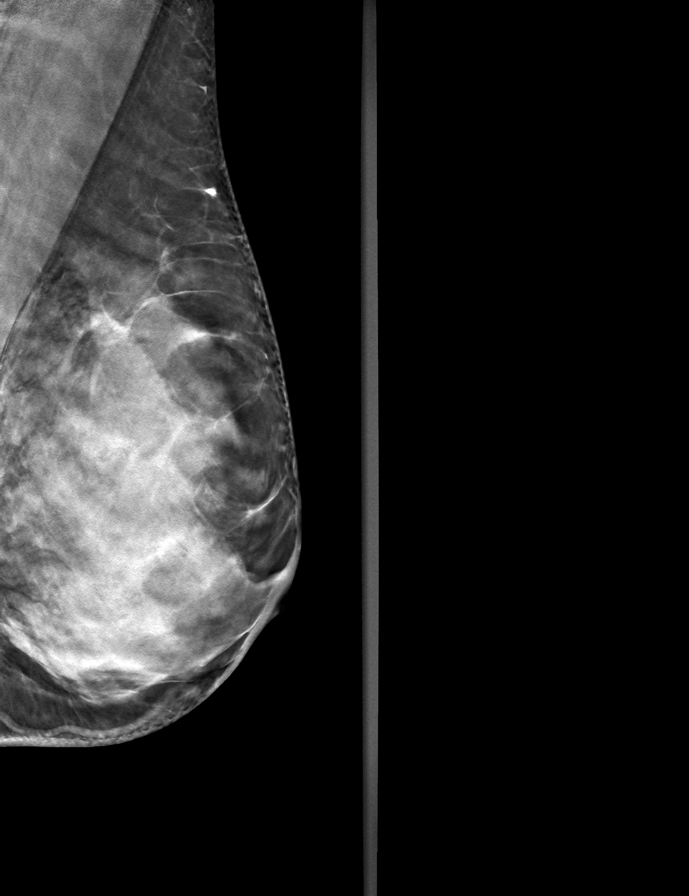

[L CC tomo · tomo slice 29/58.0]
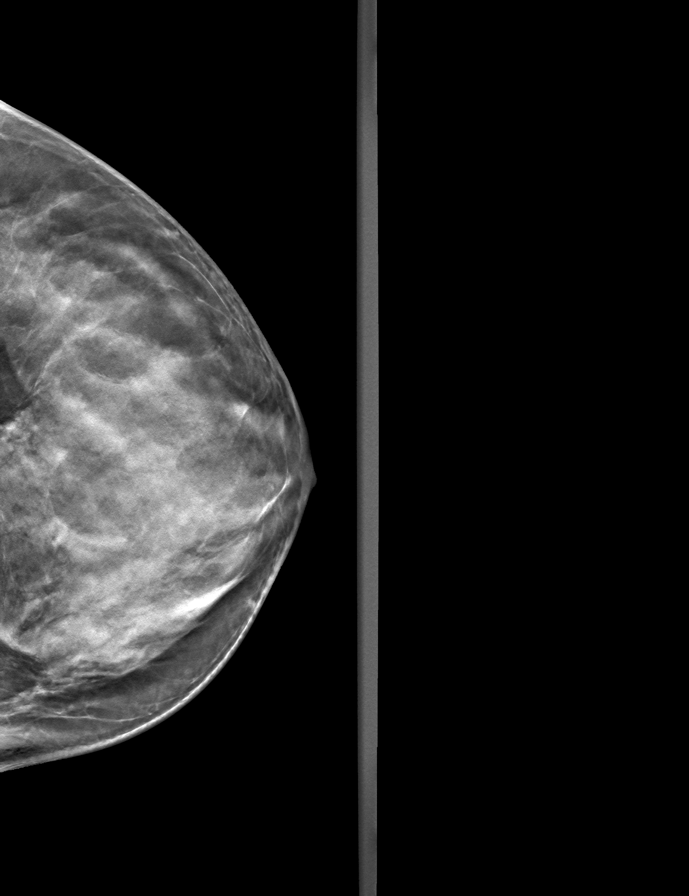

[R MLO tomo · tomo slice 31/60.0]
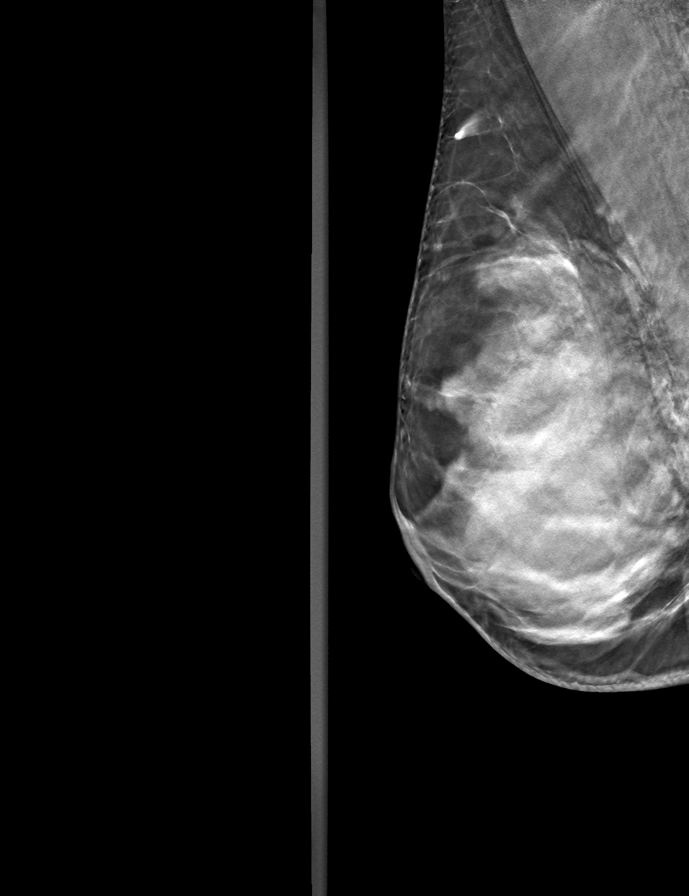

[R CC tomo · tomo slice 32/63.0]
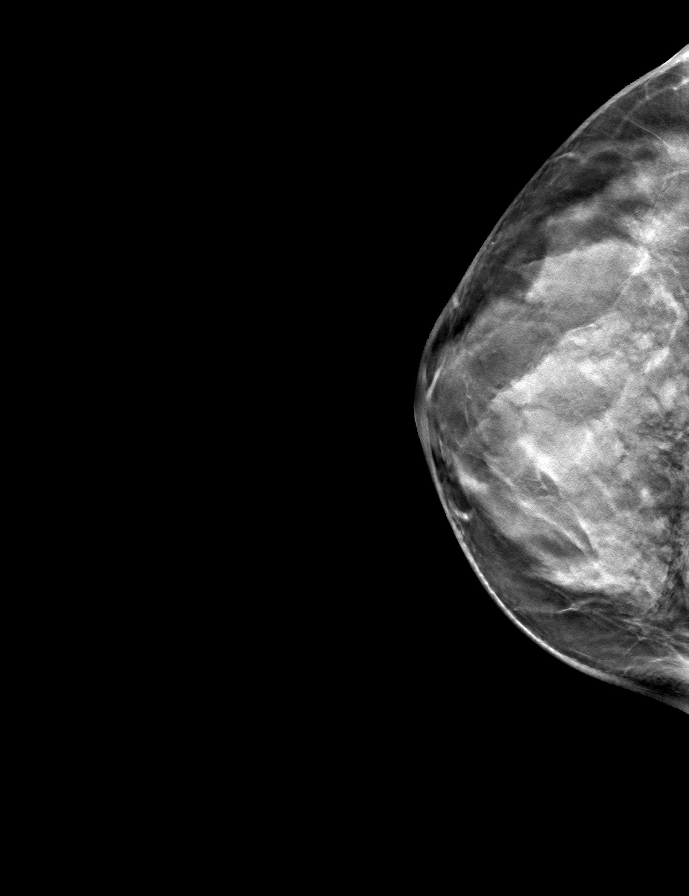

[9 of 24 positions shown; findings below may reference images not displayed]

ACR Breast Density Category c: The breast tissue is heterogeneously
dense, which may obscure small masses.
FINDINGS: There are no findings suspicious for malignancy.
IMPRESSION: No mammographic evidence of malignancy. A result letter of this
screening mammogram will be mailed directly to the patient.

RECOMMENDATION:
Screening mammogram in one year. (Code:[id number])

BI-RADS CATEGORY  1: Negative.

## 2022-08-12 NOTE — Telephone Encounter (Signed)
I called patient and she does need this refilled, she also sent a message on 08/09/22 stating that she needed it filled.

## 2022-09-04 ENCOUNTER — Other Ambulatory Visit (HOSPITAL_BASED_OUTPATIENT_CLINIC_OR_DEPARTMENT_OTHER): Payer: Self-pay | Admitting: Obstetrics & Gynecology

## 2022-09-04 DIAGNOSIS — Z79890 Hormone replacement therapy (postmenopausal): Secondary | ICD-10-CM

## 2022-09-22 ENCOUNTER — Other Ambulatory Visit: Payer: Self-pay | Admitting: Family Medicine

## 2022-10-19 ENCOUNTER — Encounter: Payer: Self-pay | Admitting: Family Medicine

## 2022-10-21 ENCOUNTER — Other Ambulatory Visit: Payer: Self-pay | Admitting: *Deleted

## 2022-10-21 DIAGNOSIS — F325 Major depressive disorder, single episode, in full remission: Secondary | ICD-10-CM

## 2022-10-21 MED ORDER — SERTRALINE HCL 100 MG PO TABS: 150.0000 mg | ORAL_TABLET | Freq: Every day | ORAL | 0 refills | Status: DC

## 2022-12-06 ENCOUNTER — Encounter: Payer: Self-pay | Admitting: *Deleted

## 2022-12-06 ENCOUNTER — Other Ambulatory Visit: Payer: Self-pay | Admitting: Family Medicine

## 2022-12-06 NOTE — Telephone Encounter (Signed)
Patient did request this medication.

## 2023-01-10 ENCOUNTER — Encounter: Payer: Self-pay | Admitting: Family Medicine

## 2023-01-10 ENCOUNTER — Other Ambulatory Visit (HOSPITAL_COMMUNITY): Payer: Self-pay

## 2023-01-10 ENCOUNTER — Other Ambulatory Visit: Payer: Self-pay | Admitting: Family Medicine

## 2023-01-10 ENCOUNTER — Telehealth: Payer: Self-pay

## 2023-01-10 ENCOUNTER — Other Ambulatory Visit (HOSPITAL_BASED_OUTPATIENT_CLINIC_OR_DEPARTMENT_OTHER): Payer: Self-pay | Admitting: *Deleted

## 2023-01-10 ENCOUNTER — Encounter (HOSPITAL_BASED_OUTPATIENT_CLINIC_OR_DEPARTMENT_OTHER): Payer: Self-pay | Admitting: Obstetrics & Gynecology

## 2023-01-10 ENCOUNTER — Other Ambulatory Visit: Payer: Self-pay

## 2023-01-10 ENCOUNTER — Other Ambulatory Visit (HOSPITAL_BASED_OUTPATIENT_CLINIC_OR_DEPARTMENT_OTHER): Payer: Self-pay | Admitting: Obstetrics & Gynecology

## 2023-01-10 DIAGNOSIS — Z79890 Hormone replacement therapy (postmenopausal): Secondary | ICD-10-CM

## 2023-01-10 MED ORDER — BIMATOPROST 0.03 % EX SOLN: CUTANEOUS | 0 refills | Status: DC

## 2023-01-10 MED ORDER — ESTRADIOL 0.5 MG PO TABS
0.5000 mg | ORAL_TABLET | Freq: Every day | ORAL | 0 refills | Status: DC
  Filled 2023-01-10: qty 30, 30d supply, fill #0

## 2023-01-10 NOTE — Telephone Encounter (Signed)
Estradiol prescription needs to come from her GYN.  If she were transferring her GYN care to me, and I am doing her breast and pelvic exams, then going forward I would be addressing the prescription at her visit. She has been under the care of Dr. Hyacinth Meeker, I have never done a physical, and it looks like she is scheduled with Dr. Hyacinth Meeker, so intends to continue the same plan (me for med checks, and Dr. Hyacinth Meeker for physicals). And it shouldn't just be estradiol--she still has a uterus, so also needs to stay on the progesterone that she also gets from Dr. Hyacinth Meeker.

## 2023-01-10 NOTE — Telephone Encounter (Signed)
Pt. Sent My Chart message requesting if you could start filling her Estradiol. Last appointment 02/20/22 next apt. 03/19/23.

## 2023-01-13 ENCOUNTER — Other Ambulatory Visit (HOSPITAL_BASED_OUTPATIENT_CLINIC_OR_DEPARTMENT_OTHER): Payer: Self-pay | Admitting: *Deleted

## 2023-01-13 ENCOUNTER — Other Ambulatory Visit (HOSPITAL_COMMUNITY): Payer: Self-pay

## 2023-01-13 ENCOUNTER — Other Ambulatory Visit: Payer: Self-pay

## 2023-01-13 DIAGNOSIS — Z79890 Hormone replacement therapy (postmenopausal): Secondary | ICD-10-CM

## 2023-01-13 MED ORDER — ESTRADIOL 0.5 MG PO TABS
0.5000 mg | ORAL_TABLET | Freq: Every day | ORAL | 0 refills | Status: DC
Start: 2023-01-13 — End: 2023-02-07

## 2023-02-07 ENCOUNTER — Other Ambulatory Visit (HOSPITAL_BASED_OUTPATIENT_CLINIC_OR_DEPARTMENT_OTHER): Payer: Self-pay | Admitting: *Deleted

## 2023-02-07 DIAGNOSIS — Z79890 Hormone replacement therapy (postmenopausal): Secondary | ICD-10-CM

## 2023-02-07 MED ORDER — ESTRADIOL 0.5 MG PO TABS
0.5000 mg | ORAL_TABLET | Freq: Every day | ORAL | 0 refills | Status: DC
Start: 2023-02-07 — End: 2023-05-08

## 2023-02-07 MED ORDER — NORETHINDRONE ACETATE 5 MG PO TABS
2.5000 mg | ORAL_TABLET | Freq: Every day | ORAL | 0 refills | Status: DC
Start: 2023-02-07 — End: 2023-08-08

## 2023-02-11 ENCOUNTER — Other Ambulatory Visit: Payer: Self-pay | Admitting: Family Medicine

## 2023-02-11 DIAGNOSIS — F325 Major depressive disorder, single episode, in full remission: Secondary | ICD-10-CM

## 2023-03-12 ENCOUNTER — Encounter: Payer: Self-pay | Admitting: Family Medicine

## 2023-03-18 NOTE — Progress Notes (Unsigned)
No chief complaint on file.  Traci Welch is a 62 y.o. female who presents for a complete physical and follow-up on chronic problems and medications. .   She would like GYN portion done with today's physical, as she plans to cancel her appointment with Dr. Hyacinth Meeker and get her HRT prescribed by me.  She has the following concerns:  Postmenopausal symptoms:  She has been on estradiol 0.5 mg tablet daily and Norethindrone 2.5 mg (1/2 of 5 mg tablet) per Dr. Hyacinth Meeker. These were both refilled for #90 (3 mos for estrogen, 6 mos for progesterone) in September by Dr. Hyacinth Meeker.  She denies any vaginal bleeding, spotting or discharge.  Hot flashes, vaginal dryness??   She has h/o elevated fasting glucose and TG in the past.  Labs were normal last year, though LDL was higher than it had been (see below). She no longer follows a keto diet, but still is low carb. Has 1-2 glasses of wine/day (10/week). She is due for recheck, is fasting today.   Depression and anxiety:  She is currently taking Zoloft 150mg  and doing well as far as depression. She denies any side effects, feels that this dose is effective. She has Propranolol to use BID prn, which she uses before social interactions (book club, dinner parties, work meetings).  Uses it about once a week.  No dizziness or side effects. She no longer sees a therapist. Last filled xanax #20 01/14/2022, uses for travel/flying.   H/o ADD:  She was started back on Ritalin in 01/2015. She used to take 10mg  on workdays during busier times of the year (not all the time). It was effective for about 4 hours, and would take it around 11 am, just once daily.  She stopped taking medication in 08/2018 when things weren't busy and she didn't need it, and then later used it just occasionally during "busy season".  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  She last filled Methylphenidate 10mg  #60  02/20/2022.  UPDATE ***   GERD:  She is taking Pantoprazole most days.  (She used to take it every 2-3 days, when she had spicy foods, or if having more than 1 glass of wine).   (Father had esophageal cancer from GERD, this worries her). She denies chest pain, dysphagia. No changes in the last year.   Vitamin D deficiency:  Last level was normal at 78.8 in 02/2022 when she had been taking 2000 IU of D3 daily, plus Ca+D and MVI daily.  She had been advised that she could cut the D dose back to 1000 IU (vs 2000 IU every other day) over the summer when getting more sun exposure. Previous values had all been low (see below).  She is currently taking   Component Ref Range & Units 1 yr ago 3 yr ago 4 yr ago 5 yr ago 6 yr ago 7 yr ago  Vit D, 25-Hydroxy 30.0 - 100.0 ng/mL 78.8 25.3 Low  CM 23.7 Low  CM 24.0 Low  CM 22 Low  R, CM 26 Low  R, CM    She saw ENT in July 2023 for tinnitus, hearing loss.  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  They discussed hearing protection, masking techniques, and consider trial of intranasal steroid spray (?poss component of tinnitus from ETD). She only reports occasional ringing.  Her main issue is popping with swallowing, and some intermittent  plugging.   ***UPDATE   Back (upper back/neck and low back) pain: This used to be only when sitting at computer for a prolonged time, but then became more chronic (not just with sitting). Described as a dull ache. Massages help. Denies any radiation of the pain, numbness, tingling or weakness. Standing desk has been helpful. Does neck stretches occasionally (not daily). Flexeril was refilled 12/2021 #30 for prn use, and is effective.  Pain flares about every other week. UPDATE ***   Insomnia: using Unisom nightly. This is effective.  Couldn't sleep when she tried stopping it in the past.   Uses Latisse--now requires it daily.  Doesn't seem to be working as well as it used to.   Immunization  History  Administered Date(s) Administered   Influenza,inj,Quad PF,6+ Mos 01/19/2015, 04/27/2018, 02/25/2019, 02/28/2020, 02/07/2021, 02/20/2022   PFIZER(Purple Top)SARS-COV-2 Vaccination 08/13/2019, 09/03/2019, 04/06/2020, 11/01/2020   Tdap 05/23/2015   Zoster Recombinant(Shingrix) 10/22/2016, 04/20/2019   Last Pap smear: 09/2020 with Dr. Hyacinth Meeker, NILM, no high risk HPV Last mammogram: 05/2022, scheduled for 05/2023 Last colonoscopy: 05/2022 with Dr. Loreta Ave.  SSP, 5 year f/u recommended Last DEXA: 04/2021 T-1.3 at R femur neck, normal elsewhere Dentist: Ophtho: Exercise:  She sees dermatologist yearly  Lipids:  LDL higher last year, due for recheck  Component Ref Range & Units 1 yr ago 4 yr ago 5 yr ago 6 yr ago 7 yr ago  Cholesterol, Total 100 - 199 mg/dL 010 High  272 536    Triglycerides 0 - 149 mg/dL 82 82 644 High  034 R 742 High  R  HDL >39 mg/dL 56 60 70 55 R 61 R  VLDL Cholesterol Cal 5 - 40 mg/dL 15 15 40    LDL Chol Calc (NIH) 0 - 99 mg/dL 595 High  638 High      Chol/HDL Ratio 0.0 - 4.4 ratio 3.8 3.3 CM 2.8 CM 3.3 R 3.4 R    PMH, PSH, SH reviewed   Review of Systems  Constitutional: Negative.  Negative for chills, fever and weight loss.  HENT:  Negative for congestion and hearing loss.   Eyes: Negative.   Respiratory:  Negative for cough, shortness of breath and wheezing.   Cardiovascular:  Negative for chest pain, palpitations and leg swelling.  Gastrointestinal:  Negative for abdominal pain, blood in stool, constipation, diarrhea, melena, nausea and vomiting.  Genitourinary:  Negative for dysuria, frequency, hematuria and urgency.  Musculoskeletal:  Negative for joint pain.  Skin:  Negative for rash.  Neurological:  Negative for dizziness, tingling, tremors, weakness and headaches.  Endo/Heme/Allergies:  Negative for environmental allergies. Does not bruise/bleed easily.  Psychiatric/Behavioral:  Negative for depression and memory loss. The patient is not  nervous/anxious and does not have insomnia.    ***UPDATE ALL Heartburn? Hearing loss?  Depression/anxiety/insomnia controlled per HPI   PHYSICAL EXAM:  LMP 09/01/2012   Wt Readings from Last 3 Encounters:  02/20/22 158 lb (71.7 kg)  11/26/21 157 lb 3.2 oz (71.3 kg)  06/14/21 140 lb (63.5 kg)    General Appearance:    Alert, cooperative, no distress, appears stated age  Head:    Normocephalic, without obvious abnormality, atraumatic  Eyes:    PERRL, conjunctiva/corneas clear, EOM's intact, fundi    benign  Ears:    Normal TM's and external ear canals  Nose:   Nares normal, mucosa normal, no drainage or sinus   tenderness  Throat:   Lips, mucosa, and tongue normal; teeth and gums normal  Neck:   Supple, no lymphadenopathy;  thyroid:  no enlargement/ tenderness/nodules; no carotid bruit or JVD  Back:    Spine nontender, no curvature, ROM normal, no CVA     tenderness  Lungs:     Clear to auscultation bilaterally without wheezes, rales or     ronchi; respirations unlabored  Chest Wall:    No tenderness or deformity   Heart:    Regular rate and rhythm, S1 and S2 normal, no murmur, rub   or gallop  Breast Exam:    No tenderness, masses, or nipple discharge or inversion.      No axillary lymphadenopathy  Abdomen:     Soft, non-tender, nondistended, normoactive bowel sounds,    no masses, no hepatosplenomegaly  Genitalia:    Normal external genitalia without lesions.  BUS and vagina normal. No abnormal vaginal discharge, no cervical motion tenderness. Uterus and adnexa not enlarged, nontender, no masses.  Pap not performed  Rectal:    Normal tone, no masses or tenderness; guaiac negative stool  Extremities:   No clubbing, cyanosis or edema  Pulses:   2+ and symmetric all extremities  Skin:   Skin color, texture, turgor normal, no rashes or lesions  Lymph nodes:   Cervical, supraclavicular, and axillary nodes normal  Neurologic:   CNII-XII intact, normal strength, sensation and gait;  reflexes 2+ and symmetric throughout          Psych:   Normal mood, affect, hygiene and grooming.     ***UPDATE ALL    ASSESSMENT/PLAN:  She is bringing forms--see if she needs waist circumference and what labs are needed.?A1c  Add Vitamin D level Add Mg (if taking protonix daily)    Phq9, gad7  Flu and COVID Can discuss RSV, likely can wait   Discussed monthly self breast exams and yearly mammograms; at least 30 minutes of aerobic activity at least 5 days/week, weight-bearing exercise at least 2x/week; proper sunscreen use reviewed; healthy diet, including goals of calcium and vitamin D intake and alcohol recommendations (less than or equal to 1 drink/day) reviewed; regular seatbelt use; changing batteries in smoke detectors.  Immunization recommendations discussed--flu shot COVID booster Colonoscopy recommendations reviewed, due again with Dr. Loreta Ave 05/2027 Pap due 2027

## 2023-03-18 NOTE — Patient Instructions (Incomplete)
  HEALTH MAINTENANCE RECOMMENDATIONS:  It is recommended that you get at least 30 minutes of aerobic exercise at least 5 days/week (for weight loss, you may need as much as 60-90 minutes). This can be any activity that gets your heart rate up. This can be divided in 10-15 minute intervals if needed, but try and build up your endurance at least once a week.  Weight bearing exercise is also recommended twice weekly.  Eat a healthy diet with lots of vegetables, fruits and fiber.  "Colorful" foods have a lot of vitamins (ie green vegetables, tomatoes, red peppers, etc).  Limit sweet tea, regular sodas and alcoholic beverages, all of which has a lot of calories and sugar.  Up to 1 alcoholic drink daily may be beneficial for women (unless trying to lose weight, watch sugars).  Drink a lot of water.  Calcium recommendations are 1200-1500 mg daily (1500 mg for postmenopausal women or women without ovaries), and vitamin D 1000 IU daily.  This should be obtained from diet and/or supplements (vitamins), and calcium should not be taken all at once, but in divided doses.  Monthly self breast exams and yearly mammograms for women over the age of 1 is recommended.  Sunscreen of at least SPF 30 should be used on all sun-exposed parts of the skin when outside between the hours of 10 am and 4 pm (not just when at beach or pool, but even with exercise, golf, tennis, and yard work!)  Use a sunscreen that says "broad spectrum" so it covers both UVA and UVB rays, and make sure to reapply every 1-2 hours.  Remember to change the batteries in your smoke detectors when changing your clock times in the spring and fall. Carbon monoxide detectors are recommended for your home.  Use your seat belt every time you are in a car, and please drive safely and not be distracted with cell phones and texting while driving.  Please eat more variety of vegetables, eat the rainbow. You need to increase your protein intake. Please cut  back on alcohol to no more than 1 5 ounce glass of wine daily, less if you're trying to cut carbs and lose weight.  I encourage you to take a multivitamin daily as long as you are using the prescription pantoprazole most days of the week. Read the info on diet for reflux.   Try and do regular back exercises and core strengthening to try and PREVENT back pain   You probably should see Dr. Loreta Ave to discuss your various GI complaints, especially regarding your ongoing rectal bleeding/hemorrhoids.

## 2023-03-19 ENCOUNTER — Encounter: Payer: Self-pay | Admitting: Family Medicine

## 2023-03-19 ENCOUNTER — Ambulatory Visit (INDEPENDENT_AMBULATORY_CARE_PROVIDER_SITE_OTHER): Payer: 59 | Admitting: Family Medicine

## 2023-03-19 VITALS — BP 120/70 | HR 80 | Ht 68.0 in | Wt 172.8 lb

## 2023-03-19 DIAGNOSIS — E7800 Pure hypercholesterolemia, unspecified: Secondary | ICD-10-CM | POA: Diagnosis not present

## 2023-03-19 DIAGNOSIS — M545 Low back pain, unspecified: Secondary | ICD-10-CM

## 2023-03-19 DIAGNOSIS — K219 Gastro-esophageal reflux disease without esophagitis: Secondary | ICD-10-CM

## 2023-03-19 DIAGNOSIS — Z0000 Encounter for general adult medical examination without abnormal findings: Secondary | ICD-10-CM

## 2023-03-19 DIAGNOSIS — F325 Major depressive disorder, single episode, in full remission: Secondary | ICD-10-CM

## 2023-03-19 DIAGNOSIS — G4700 Insomnia, unspecified: Secondary | ICD-10-CM

## 2023-03-19 DIAGNOSIS — Z23 Encounter for immunization: Secondary | ICD-10-CM

## 2023-03-19 DIAGNOSIS — E559 Vitamin D deficiency, unspecified: Secondary | ICD-10-CM

## 2023-03-19 DIAGNOSIS — K625 Hemorrhage of anus and rectum: Secondary | ICD-10-CM

## 2023-03-19 DIAGNOSIS — Z5181 Encounter for therapeutic drug level monitoring: Secondary | ICD-10-CM

## 2023-03-19 DIAGNOSIS — F988 Other specified behavioral and emotional disorders with onset usually occurring in childhood and adolescence: Secondary | ICD-10-CM

## 2023-03-19 DIAGNOSIS — Z79890 Hormone replacement therapy (postmenopausal): Secondary | ICD-10-CM

## 2023-03-19 DIAGNOSIS — F419 Anxiety disorder, unspecified: Secondary | ICD-10-CM

## 2023-03-19 DIAGNOSIS — R635 Abnormal weight gain: Secondary | ICD-10-CM

## 2023-03-19 LAB — POCT URINALYSIS DIP (PROADVANTAGE DEVICE)
Bilirubin, UA: NEGATIVE
Glucose, UA: NEGATIVE mg/dL
Ketones, POC UA: NEGATIVE mg/dL
Leukocytes, UA: NEGATIVE — AB
Nitrite, UA: NEGATIVE
Protein Ur, POC: NEGATIVE mg/dL
Specific Gravity, Urine: 1.01
Urobilinogen, Ur: 0.2
pH, UA: 7.5 (ref 5.0–8.0)

## 2023-03-19 MED ORDER — METHYLPHENIDATE HCL 10 MG PO TABS
10.0000 mg | ORAL_TABLET | Freq: Two times a day (BID) | ORAL | 0 refills | Status: DC
Start: 2023-03-19 — End: 2023-08-25

## 2023-03-19 MED ORDER — CYCLOBENZAPRINE HCL 10 MG PO TABS: 5.0000 mg | ORAL_TABLET | Freq: Three times a day (TID) | ORAL | 0 refills | Status: AC | PRN

## 2023-03-19 MED ORDER — ALPRAZOLAM 0.25 MG PO TABS: 0.2500 mg | ORAL_TABLET | Freq: Three times a day (TID) | ORAL | 0 refills | Status: DC | PRN

## 2023-03-19 MED ORDER — SERTRALINE HCL 100 MG PO TABS
150.0000 mg | ORAL_TABLET | Freq: Every day | ORAL | 3 refills | Status: DC
Start: 2023-03-19 — End: 2023-08-25

## 2023-03-19 MED ORDER — PANTOPRAZOLE SODIUM 40 MG PO TBEC
40.0000 mg | DELAYED_RELEASE_TABLET | Freq: Every day | ORAL | 3 refills | Status: DC
Start: 2023-03-19 — End: 2023-11-28

## 2023-03-20 ENCOUNTER — Encounter: Payer: Self-pay | Admitting: Family Medicine

## 2023-03-20 LAB — LIPID PANEL
Cholesterol, Total: 200 mg/dL — ABNORMAL HIGH (ref 100–199)
HDL: 60 mg/dL (ref >39)
LDL CALC COMMENT:: 3.3 ratio (ref 0.0–4.4)
LDL Chol Calc (NIH): 121 mg/dL — ABNORMAL HIGH (ref 0–99)
Triglycerides: 108 mg/dL (ref 0–149)
VLDL Cholesterol Cal: 19 mg/dL (ref 5–40)

## 2023-03-20 LAB — CMP14+EGFR
ALT: 15 IU/L (ref 0–32)
AST: 17 IU/L (ref 0–40)
Albumin: 4.5 g/dL (ref 3.9–4.9)
Alkaline Phosphatase: 60 IU/L (ref 44–121)
BUN/Creatinine Ratio: 12 (ref 12–28)
BUN: 10 mg/dL (ref 8–27)
Bilirubin Total: 0.5 mg/dL (ref 0.0–1.2)
CO2: 22 mmol/L (ref 20–29)
Calcium: 9.2 mg/dL (ref 8.7–10.3)
Chloride: 101 mmol/L (ref 96–106)
Creatinine, Ser: 0.84 mg/dL (ref 0.57–1.00)
Globulin, Total: 2.1 g/dL (ref 1.5–4.5)
Glucose: 98 mg/dL (ref 70–99)
Potassium: 5.1 mmol/L (ref 3.5–5.2)
Sodium: 135 mmol/L (ref 134–144)
Total Protein: 6.6 g/dL (ref 6.0–8.5)
eGFR: 79 mL/min/{1.73_m2} (ref >59)

## 2023-03-20 LAB — CBC WITH DIFFERENTIAL/PLATELET
Basophils Absolute: 0 10*3/uL (ref 0.0–0.2)
Basos: 1 %
EOS (ABSOLUTE): 0.1 10*3/uL (ref 0.0–0.4)
Eos: 1 %
Hematocrit: 45.4 % (ref 34.0–46.6)
Hemoglobin: 14.9 g/dL (ref 11.1–15.9)
Immature Grans (Abs): 0 10*3/uL (ref 0.0–0.1)
Immature Granulocytes: 0 %
Lymphocytes Absolute: 1.9 10*3/uL (ref 0.7–3.1)
Lymphs: 27 %
MCH: 32.3 pg (ref 26.6–33.0)
MCHC: 32.8 g/dL (ref 31.5–35.7)
MCV: 99 fL — ABNORMAL HIGH (ref 79–97)
Monocytes Absolute: 0.5 10*3/uL (ref 0.1–0.9)
Monocytes: 7 %
Neutrophils Absolute: 4.4 10*3/uL (ref 1.4–7.0)
Neutrophils: 64 %
Platelets: 272 10*3/uL (ref 150–450)
RBC: 4.61 x10E6/uL (ref 3.77–5.28)
RDW: 11.2 % — ABNORMAL LOW (ref 11.7–15.4)
WBC: 6.8 10*3/uL (ref 3.4–10.8)

## 2023-03-20 LAB — TSH: TSH: 1.06 u[IU]/mL (ref 0.450–4.500)

## 2023-03-20 LAB — VITAMIN D 25 HYDROXY (VIT D DEFICIENCY, FRACTURES): Vit D, 25-Hydroxy: 124 ng/mL — ABNORMAL HIGH (ref 30.0–100.0)

## 2023-04-08 ENCOUNTER — Other Ambulatory Visit: Payer: Self-pay | Admitting: Medical Genetics

## 2023-04-08 DIAGNOSIS — Z006 Encounter for examination for normal comparison and control in clinical research program: Secondary | ICD-10-CM

## 2023-04-09 ENCOUNTER — Other Ambulatory Visit: Payer: Self-pay | Admitting: Family Medicine

## 2023-04-09 DIAGNOSIS — Z1231 Encounter for screening mammogram for malignant neoplasm of breast: Secondary | ICD-10-CM

## 2023-04-18 ENCOUNTER — Ambulatory Visit (HOSPITAL_BASED_OUTPATIENT_CLINIC_OR_DEPARTMENT_OTHER): Payer: 59 | Admitting: Obstetrics & Gynecology

## 2023-04-28 ENCOUNTER — Other Ambulatory Visit (HOSPITAL_COMMUNITY)
Admission: RE | Admit: 2023-04-28 | Discharge: 2023-04-28 | Disposition: A | Discharge status: Home / Self Care | Payer: 59 | Source: Ambulatory Visit | Attending: Oncology | Admitting: Oncology

## 2023-04-28 DIAGNOSIS — Z006 Encounter for examination for normal comparison and control in clinical research program: Secondary | ICD-10-CM | POA: Insufficient documentation

## 2023-05-08 ENCOUNTER — Other Ambulatory Visit: Payer: Self-pay | Admitting: Family Medicine

## 2023-05-08 ENCOUNTER — Encounter: Payer: Self-pay | Admitting: Family Medicine

## 2023-05-08 DIAGNOSIS — Z79890 Hormone replacement therapy (postmenopausal): Secondary | ICD-10-CM

## 2023-05-08 MED ORDER — ESTRADIOL 0.5 MG PO TABS: 0.5000 mg | ORAL_TABLET | Freq: Every day | ORAL | 10 refills | Status: DC

## 2023-05-09 ENCOUNTER — Ambulatory Visit
Admission: RE | Admit: 2023-05-09 | Discharge: 2023-05-09 | Disposition: A | Discharge status: Home / Self Care | Payer: 59 | Source: Ambulatory Visit

## 2023-05-09 DIAGNOSIS — Z1231 Encounter for screening mammogram for malignant neoplasm of breast: Secondary | ICD-10-CM

## 2023-05-16 LAB — GENECONNECT MOLECULAR SCREEN: Genetic Analysis Overall Interpretation: NEGATIVE

## 2023-06-19 ENCOUNTER — Other Ambulatory Visit: Payer: BC Managed Care – PPO

## 2023-06-19 DIAGNOSIS — E559 Vitamin D deficiency, unspecified: Secondary | ICD-10-CM

## 2023-06-20 LAB — VITAMIN D 25 HYDROXY (VIT D DEFICIENCY, FRACTURES): Vit D, 25-Hydroxy: 43.8 ng/mL (ref 30.0–100.0)

## 2023-07-12 ENCOUNTER — Other Ambulatory Visit: Payer: Self-pay | Admitting: Family Medicine

## 2023-07-12 DIAGNOSIS — F325 Major depressive disorder, single episode, in full remission: Secondary | ICD-10-CM

## 2023-07-29 ENCOUNTER — Other Ambulatory Visit: Payer: Self-pay | Admitting: Family Medicine

## 2023-07-30 NOTE — Telephone Encounter (Signed)
 Is this okay to refill?

## 2023-07-31 ENCOUNTER — Encounter: Payer: Self-pay | Admitting: Family Medicine

## 2023-08-08 ENCOUNTER — Other Ambulatory Visit (HOSPITAL_BASED_OUTPATIENT_CLINIC_OR_DEPARTMENT_OTHER): Payer: Self-pay | Admitting: Obstetrics & Gynecology

## 2023-08-08 ENCOUNTER — Encounter: Payer: Self-pay | Admitting: Family Medicine

## 2023-08-08 DIAGNOSIS — Z79890 Hormone replacement therapy (postmenopausal): Secondary | ICD-10-CM

## 2023-08-08 MED ORDER — NORETHINDRONE ACETATE 5 MG PO TABS: 2.5000 mg | ORAL_TABLET | Freq: Every day | ORAL | 7 refills | Status: DC

## 2023-08-08 NOTE — Telephone Encounter (Signed)
 LMOVM for pt to call regarding refill. Pt needs appt.

## 2023-08-11 NOTE — Telephone Encounter (Signed)
LMOVM for pt to call regarding refill request (needs appt)

## 2023-08-12 ENCOUNTER — Encounter (HOSPITAL_BASED_OUTPATIENT_CLINIC_OR_DEPARTMENT_OTHER): Payer: Self-pay | Admitting: Obstetrics & Gynecology

## 2023-08-12 NOTE — Telephone Encounter (Signed)
 LMOM at 4:21 for patient to call office in regards to Rx refill request. tbw

## 2023-08-21 ENCOUNTER — Encounter: Payer: Self-pay | Admitting: Family Medicine

## 2023-08-21 NOTE — Telephone Encounter (Signed)
 Requires visit, can be virtual. Need to discuss how to taper, that wellbutrin doesn't treat anxiety as well as the zoloft (is just an antidepressant), which treats both depression and anxiety, and side effects.

## 2023-08-21 NOTE — Telephone Encounter (Signed)
 Patient scheduled for 3/24-25.

## 2023-08-23 NOTE — Progress Notes (Unsigned)
 Start time: End time:  Virtual Visit via Video Note  I connected with Traci Welch on 08/23/23 by a video enabled telemedicine application and verified that I am speaking with the correct person using two identifiers.  Location: Patient: *** Provider: office   I discussed the limitations of evaluation and management by telemedicine and the availability of in person appointments. The patient expressed understanding and agreed to proceed.  History of Present Illness:  No chief complaint on file.  Patient sent the following message 08/21/23: "I have been reading that long term use of Zoloft could cause weight gain (or least make losing weight more difficult), especially after long term use. I have been on Zoloft for so many years, I feel it may be losing it's effect any way. Could I try switching to Bupropion (Wellbutrin)? I realize I would have to do it gradually. I was on Bupropion years ago, and had no problems with it."  +weight gain noted--gained 14# from 02/2022 to 03/2023. We had discussed potentially trying to cut back sertraline dose to 100 mg (when less stressed), and discussed healthy diet, recommended cutting back on alcohol intake (had reported 1 drink nightly, 2 if going out).   Depression and anxiety:  She has been doing well on Zoloft 150mg .  She denies any side effects, feels that this dose has been effective in treating depression. She previously has used Propranolol prn, used before social interactions (book club, dinner parties, work meetings).  She hasn't been using this because it made her a little groggy.  She no longer sees a therapist. Last filled xanax #20 03/19/2023, uses for travel/flying.    H/o ADD:  She was started back on Ritalin in 01/2015. She used to take 10mg  on workdays during busier times of the year (not all the time). It was effective for about 4 hours, and would take it around 11 am, just once daily.  She stopped taking medication in 08/2018 when  things weren't busy and she didn't need it, and then later used it just occasionally during "busy season".   With her current job, in the office 3 days/week and more stressful, with a lot more work, she has been using the ritalin *** UPDATE *** She last filled Methylphenidate 10mg  #60 03/19/2023    Observations/Objective:  LMP 09/01/2012   Wt Readings from Last 3 Encounters:  03/19/23 172 lb 12.8 oz (78.4 kg)  02/20/22 158 lb (71.7 kg)  11/26/21 157 lb 3.2 oz (71.3 kg)      Assessment and Plan:   Phq-9 and gad-7 to establish baseline prior to medication changes.   Wellbutrin XL 150 mg Sertraline 100 mg  Wellbutrin XL 300 mg Sertraline 50 mg  Consider stay there (to get benefit for anxiety), vs further taper of the 50 mg dose of sertraline, and add buspar if anxiety worsens. Wellbutrin may help with focus, may need ritalin a little less.   Follow Up Instructions:    I discussed the assessment and treatment plan with the patient. The patient was provided an opportunity to ask questions and all were answered. The patient agreed with the plan and demonstrated an understanding of the instructions.   The patient was advised to call back or seek an in-person evaluation if the symptoms worsen or if the condition fails to improve as anticipated.  I spent *** minutes dedicated to the care of this patient, including pre-visit review of records, face to face time, post-visit ordering of testing and documentation.  Lavonda Jumbo, MD

## 2023-08-25 ENCOUNTER — Encounter: Payer: Self-pay | Admitting: Family Medicine

## 2023-08-25 ENCOUNTER — Telehealth (INDEPENDENT_AMBULATORY_CARE_PROVIDER_SITE_OTHER): Admitting: Family Medicine

## 2023-08-25 VITALS — BP 130/90 | Ht 68.0 in | Wt 168.0 lb

## 2023-08-25 DIAGNOSIS — F325 Major depressive disorder, single episode, in full remission: Secondary | ICD-10-CM | POA: Diagnosis not present

## 2023-08-25 DIAGNOSIS — F419 Anxiety disorder, unspecified: Secondary | ICD-10-CM

## 2023-08-25 DIAGNOSIS — F988 Other specified behavioral and emotional disorders with onset usually occurring in childhood and adolescence: Secondary | ICD-10-CM

## 2023-08-25 MED ORDER — SERTRALINE HCL 50 MG PO TABS: 50.0000 mg | ORAL_TABLET | Freq: Every day | ORAL | 2 refills | Status: DC

## 2023-08-25 MED ORDER — METHYLPHENIDATE HCL 10 MG PO TABS: 10.0000 mg | ORAL_TABLET | Freq: Two times a day (BID) | ORAL | 0 refills | Status: AC

## 2023-08-25 MED ORDER — BUPROPION HCL ER (XL) 150 MG PO TB24
150.0000 mg | ORAL_TABLET | Freq: Every day | ORAL | 0 refills | Status: DC
Start: 2023-08-25 — End: 2023-10-01

## 2023-08-25 MED ORDER — BUPROPION HCL ER (XL) 300 MG PO TB24
300.0000 mg | ORAL_TABLET | Freq: Every day | ORAL | 1 refills | Status: DC
Start: 2023-08-25 — End: 2023-10-01

## 2023-08-25 NOTE — Patient Instructions (Signed)
 Wellbutrin XL 150 mg--take 1 tablet by mouth every morning. Sertraline 100 mg tablets--alternate 1 tablet (100 mg) with 1/2 tablet (50 mg) every other day for 10-14 days, then decrease to 50 mg daily (either 1/2 tablet of the 100mg  if you have any left, or the 50 mg tablet that is being sent in).  When you are at 50 mg once daily, increase the Wellbutrin to 300 mg daily.  You can double up on the 150 mg tablets that you have, and pick up the 300 mg prescription once you finish the 150 mg tablets.  Stay on this combination (300 mg of wellbutrin, 50 mg of sertraline) for 2 weeks. If you aren't having any worsening of anxiety, you can cut the 50 mg tablet in 1/2, and take 1/2 tablet daily for 7-10 days, and then stop (or 1/2 tablet every other day for a week, if needed). Stay at the 300 mg dose of wellbutrin.  If having worsening anxiety, we discussed potentially re-trying buspar. We also discussed re-trying the propranolol for social/performance anxiety, but start at 1/2 tablet since the full tablet made you tired.  We also discussed fluoxetine (prozac) as an alternative to sertraline--if you aren't tolerating wellbutrin, or if buspar isn't helping with anxiety.  Feel free to reach out in the next few weeks to let me know how you're doing. Please schedule a 6 week follow-up so we can figure out the next steps, based on how you're doing with this plan.

## 2023-09-09 DIAGNOSIS — L728 Other follicular cysts of the skin and subcutaneous tissue: Secondary | ICD-10-CM | POA: Diagnosis not present

## 2023-09-09 DIAGNOSIS — D225 Melanocytic nevi of trunk: Secondary | ICD-10-CM | POA: Diagnosis not present

## 2023-09-09 DIAGNOSIS — L814 Other melanin hyperpigmentation: Secondary | ICD-10-CM | POA: Diagnosis not present

## 2023-09-09 DIAGNOSIS — L821 Other seborrheic keratosis: Secondary | ICD-10-CM | POA: Diagnosis not present

## 2023-09-30 NOTE — Progress Notes (Unsigned)
 Start time: End time:  Virtual Visit via Video Note  I connected with Traci Welch on 09/30/23 by a video enabled telemedicine application and verified that I am speaking with the correct person using two identifiers.  Location: Patient: *** Provider: office   I discussed the limitations of evaluation and management by telemedicine and the availability of in person appointments. The patient expressed understanding and agreed to proceed.  History of Present Illness:  No chief complaint on file.  Patient presents for 6 week follow-up on depression and anxiety. She wanted to taper off sertraline , and switch to Wellbutrin . This was the plan:  Wellbutrin  XL 150 mg--take 1 tablet by mouth every morning. Sertraline  100 mg tablets--alternate 1 tablet (100 mg) with 1/2 tablet (50 mg) every other day for 10-14 days, then decrease to 50 mg daily (either 1/2 tablet of the 100mg  if you have any left, or the 50 mg tablet that is being sent in). When you are at 50 mg once daily, increase the Wellbutrin  to 300 mg daily.  You can double up on the 150 mg tablets that you have, and pick up the 300 mg prescription once you finish the 150 mg tablets.   Stay on this combination (300 mg of wellbutrin , 50 mg of sertraline ) for 2 weeks. If you aren't having any worsening of anxiety, you can cut the 50 mg tablet in 1/2, and take 1/2 tablet daily for 7-10 days, and then stop (or 1/2 tablet every other day for a week, if needed). Stay at the 300 mg dose of wellbutrin .   If having worsening anxiety, we discussed potentially re-trying buspar. We also discussed re-trying the propranolol  for social/performance anxiety, but start at 1/2 tablet since the full tablet made you tired.   We also discussed fluoxetine (prozac) as an alternative to sertraline --if you aren't tolerating wellbutrin , or if buspar isn't helping with anxiety.      Observations/Objective:  LMP 09/01/2012   Wt Readings from Last 3  Encounters:  08/25/23 168 lb (76.2 kg)  03/19/23 172 lb 12.8 oz (78.4 kg)  02/20/22 158 lb (71.7 kg)     Assessment and Plan:  GAD-7, PHQ-9   Follow Up Instructions:    I discussed the assessment and treatment plan with the patient. The patient was provided an opportunity to ask questions and all were answered. The patient agreed with the plan and demonstrated an understanding of the instructions.   The patient was advised to call back or seek an in-person evaluation if the symptoms worsen or if the condition fails to improve as anticipated.  I spent *** minutes dedicated to the care of this patient, including pre-visit review of records, face to face time, post-visit ordering of testing and documentation.    Paulett Boros, MD

## 2023-10-01 ENCOUNTER — Telehealth (INDEPENDENT_AMBULATORY_CARE_PROVIDER_SITE_OTHER): Admitting: Family Medicine

## 2023-10-01 ENCOUNTER — Encounter: Payer: Self-pay | Admitting: *Deleted

## 2023-10-01 ENCOUNTER — Encounter: Payer: Self-pay | Admitting: Family Medicine

## 2023-10-01 ENCOUNTER — Telehealth: Payer: Self-pay | Admitting: Family Medicine

## 2023-10-01 VITALS — Ht 68.0 in | Wt 170.0 lb

## 2023-10-01 DIAGNOSIS — F40243 Fear of flying: Secondary | ICD-10-CM | POA: Diagnosis not present

## 2023-10-01 DIAGNOSIS — F325 Major depressive disorder, single episode, in full remission: Secondary | ICD-10-CM | POA: Diagnosis not present

## 2023-10-01 MED ORDER — ALPRAZOLAM 0.25 MG PO TABS: 0.2500 mg | ORAL_TABLET | Freq: Three times a day (TID) | ORAL | 0 refills | Status: AC | PRN

## 2023-10-01 MED ORDER — BUPROPION HCL ER (XL) 300 MG PO TB24: 300.0000 mg | ORAL_TABLET | Freq: Every day | ORAL | 1 refills | Status: DC

## 2023-10-01 NOTE — Telephone Encounter (Signed)
 Left message for pt re Dr. Chase Copping message on alprazolam  rx  and asked her to call if she needs us  to do anything further re rx

## 2023-10-01 NOTE — Telephone Encounter (Signed)
 She has gotten this many times. Likely can pay cash or use coupon. She should check with the pharmacy directly. Please advise pt about this notification.

## 2023-10-01 NOTE — Telephone Encounter (Signed)
 Fax from [employer] [facility]  Alprazolam  0.25 mg   Drug not covered by plan, preferred alternative alprazolam  abodt

## 2023-11-27 ENCOUNTER — Other Ambulatory Visit: Payer: Self-pay | Admitting: Family Medicine

## 2023-11-27 DIAGNOSIS — K219 Gastro-esophageal reflux disease without esophagitis: Secondary | ICD-10-CM

## 2023-11-27 DIAGNOSIS — F325 Major depressive disorder, single episode, in full remission: Secondary | ICD-10-CM

## 2023-11-27 DIAGNOSIS — F419 Anxiety disorder, unspecified: Secondary | ICD-10-CM

## 2023-12-01 ENCOUNTER — Encounter: Payer: Self-pay | Admitting: Family Medicine

## 2023-12-01 DIAGNOSIS — F325 Major depressive disorder, single episode, in full remission: Secondary | ICD-10-CM

## 2023-12-01 MED ORDER — BUPROPION HCL ER (XL) 150 MG PO TB24: 150.0000 mg | ORAL_TABLET | Freq: Every day | ORAL | 1 refills | Status: DC

## 2023-12-02 ENCOUNTER — Encounter: Payer: Self-pay | Admitting: Family Medicine

## 2023-12-02 ENCOUNTER — Other Ambulatory Visit: Payer: Self-pay | Admitting: *Deleted

## 2023-12-02 DIAGNOSIS — F325 Major depressive disorder, single episode, in full remission: Secondary | ICD-10-CM

## 2023-12-02 MED ORDER — BUPROPION HCL ER (XL) 150 MG PO TB24: 150.0000 mg | ORAL_TABLET | Freq: Every day | ORAL | 1 refills | Status: AC

## 2023-12-11 NOTE — Telephone Encounter (Signed)
 I have not seen, but it would have went to order level to be abstracted and placed in chart

## 2024-01-02 ENCOUNTER — Other Ambulatory Visit: Payer: Self-pay | Admitting: Family Medicine

## 2024-01-07 ENCOUNTER — Encounter: Payer: Self-pay | Admitting: Family Medicine

## 2024-01-07 DIAGNOSIS — F325 Major depressive disorder, single episode, in full remission: Secondary | ICD-10-CM

## 2024-01-07 DIAGNOSIS — F419 Anxiety disorder, unspecified: Secondary | ICD-10-CM

## 2024-01-07 MED ORDER — SERTRALINE HCL 100 MG PO TABS: 100.0000 mg | ORAL_TABLET | Freq: Every day | ORAL | 0 refills | Status: DC

## 2024-01-26 ENCOUNTER — Other Ambulatory Visit: Payer: Self-pay | Admitting: Family Medicine

## 2024-01-26 ENCOUNTER — Encounter: Payer: Self-pay | Admitting: *Deleted

## 2024-01-26 NOTE — Telephone Encounter (Signed)
 Is this okay to refill?

## 2024-01-26 NOTE — Telephone Encounter (Signed)
 Patient states she uses about twice weekly.

## 2024-01-26 NOTE — Telephone Encounter (Signed)
 Left message asking patient if she needs this and how often she uses.

## 2024-01-27 ENCOUNTER — Other Ambulatory Visit: Payer: Self-pay | Admitting: Medical

## 2024-01-27 MED ORDER — HYDROCORTISONE (PERIANAL) 2.5 % EX CREA: TOPICAL_CREAM | CUTANEOUS | 0 refills | Status: DC

## 2024-01-28 ENCOUNTER — Encounter: Payer: Self-pay | Admitting: Family Medicine

## 2024-03-16 DIAGNOSIS — Z23 Encounter for immunization: Secondary | ICD-10-CM | POA: Diagnosis not present

## 2024-03-30 NOTE — Progress Notes (Unsigned)
 No chief complaint on file.  Traci Welch is a 63 y.o. female who presents for a complete physical and follow-up on chronic problems and medications.  Postmenopausal symptoms:  Last year she asked me to take over her HRT, which had previously been prescribed by Dr. Cleotilde. She was taking estradiol  0.5 mg tablet daily and Norethindrone  2.5 mg daily (1/2 of 5 mg tablet) per Dr. Cleotilde.  She was doing well on this regimen, without vaginal bleeding, spotting or discharge. She wasn't having hot flashes, but was last year reported having hot flashes about 5 nights/week. Today she reports ***  ENSURE ON PROGESTERONE!!   She has h/o elevated fasting glucose and TG in the past.  Glucose and lipids were improved last year; LDL had been up to 139 two years ago, down to 121 last year.  She no longer follows a keto diet, but still is low carb. Has 1-2 glasses of wine/day (10/week).  Diet-- Eats a lot of salads, chicken. Limits dressing, some olive oil. Some cheese on salads (feta or cheddar). Eggs--1 hard boiled egg daily. Red meat once a week. Occasional french fries, once a week. No other fried foods.  Typical diet-- Brunch; 1 Egg, guacamole, spinach, squash Dinner--chicken, salad (mixed greens, kale, spinach, onions). Snacks--almonds, fruit.  Occasional yogurt. Beverages--water. Wine 2 glasses/day.  Component Ref Range & Units (hover) 1 yr ago 2 yr ago 5 yr ago 6 yr ago 7 yr ago 8 yr ago  Glucose 98 96 104 High  R 74 R 100 High  R 77 R    Component Ref Range & Units (hover) 1 yr ago 2 yr ago 5 yr ago 6 yr ago 7 yr ago 8 yr ago  Cholesterol, Total 200 High  210 High  199 195    Triglycerides 108 82 82 198 High  119 R 209 High  R  HDL 60 56 60 70 55 R 61 R  VLDL Cholesterol Cal 19 15 15  40    LDL Chol Calc (NIH) 121 High  139 High  124 High      Chol/HDL Ratio 3.3 3.8 CM 3.3 CM 2.8 CM 3.3 R 3.4 R     Depression and anxiety:  She had been on zoloft  150 mg for many years. It worked  very well, but she was concerned about the possible weight gain. She had sent a message 08/2023 requesting a switch to wellbutrin  instead.  She had decreased the dose of zoloft  to 100 mg, without any worsening of moods.  She was started on wellbutrin , and sertraline  was further decreased to 50 mg. She did well on this initially, but  12/01/23 she sent a message stating that on the 300 mg of wellbutrin  she was noting hand tremors and gritting, gritting her teeth along with an occasional headache. She felt better when cutting in half, along with the 50 mg of sertraline . She was switched to 150 mg dose (not supposed to cut LA pills) at that time. 01/07/24 she sent a message stating she wanted to go back on 100 mg of sertraline  and stop the wellbutrin --she was feeling more down and irritable. She hadn't lost weight with the change, and preferred to go back on what had worked for her.  Currently she reports ***  She uses xanax  for travel/flying. She used to use propranolol  before social interactions (book club, dinner parties, work meetings), but stopped using it as it made her a little groggy. She does not see a therapist. Last  filled xanax  #20 on 10/06/23.    H/o ADD:  She was started back on Ritalin  in 01/2015. She used to take 10mg  on workdays during busier times of the year (not all the time). It was effective for about 4 hours, and would take it around 11 am, just once daily.  She stopped taking medication in 08/2018 when things weren't busy and she didn't need it, and then later used it just occasionally during busy season.   With her current job, in the office 3 days/week and more stressful, with a lot more work, she initially used the ritalin  more often.  She reports today using it only when busy, about 1-2 weeks every 2 months or so. ***UPDATE  Last filled #60 on 08/26/23, prior fill was in 03/2023.  GERD:  She is taking Pantoprazole  prior to spicy foods, about 5 days/week. Takes it prn if she gets  heartburn (and hasn't taken pill), gets heartburn about 4x/week. She denies chest pain, dysphagia. No changes in the last year. She is no longer taking a MVI daily. ***UPDATE +weight gain and 1-2 glasses of wine daily.  Vitamin D  deficiency:  Last year her vitamin D  level was high at 124, when she was taking 2000 IU daily (hadn't taken MVI in 8 months).  She was advised to stop D3, and only take MVI daily. Recheck was improved at 43.8.  She is due for recheck today. She is currently taking ***           Component Ref Range & Units (hover) 9 mo ago 1 yr ago 2 yr ago 4 yr ago 5 yr ago 6 yr ago 7 yr ago  Vit D, 25-Hydroxy 43.8 124.0 High  CM 78.8 CM 25.3 Low  CM 23.7 Low  CM 24.0 Low  CM 22 Low  R, CM      She saw ENT in July 2023 for tinnitus, hearing loss.  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  They discussed hearing protection, masking techniques, and consider trial of intranasal steroid spray (?poss component of tinnitus from ETD). She uses flonase for seasonal allergies, which helped, but noted persistent popping of her ears.  She has intermittent tinnitus. She had hearing evaluation in 11/2023 through [facility], which confirmed bilateral sensorineural hearing loss.  ***   Back (upper back/neck and low back) pain: This used to be only when sitting at computer for a prolonged time, but then became more chronic (not just with sitting). Described as a dull ache. Massages help. Denies any radiation of the pain, numbness, tingling or weakness. Standing desk has been helpful. Overall, unchanged. Flares (back aches) about every other week, and 1 flexeril  is effective. Does stretches occasionally (not daily). Flexeril  was refilled last in 03/2023 for #30 for prn use, and is effective.    Insomnia: using Unisom nightly. This is effective.  Couldn't sleep when she tried stopping it in the past.   Uses Latisse --now requires it daily. She is happy with the  results.   Immunization History  Administered Date(s) Administered   Influenza,inj,Quad PF,6+ Mos 01/19/2015, 04/27/2018, 02/25/2019, 02/28/2020, 02/07/2021, 02/20/2022   Influenza-Unspecified 03/13/2023   PFIZER(Purple Top)SARS-COV-2 Vaccination 08/13/2019, 09/03/2019, 04/06/2020, 11/01/2020   Pfizer(Comirnaty)Fall Seasonal Vaccine 12 years and older 03/13/2023   Tdap 05/23/2015   Zoster Recombinant(Shingrix) 10/22/2016, 04/20/2019   Last Pap smear: 09/2020 with Dr. Cleotilde, NILM, no high risk HPV Last mammogram: 05/2023 Last colonoscopy: 05/2022 with Dr. Kristie.  SSP, 5 year f/u recommended Last DEXA:  04/2021 T-1.3 at R femur neck, normal elsewhere Dentist: twice a year Ophtho: yearly Exercise:   5 days/week--walks 30 minutes, yoga 4 days/week (at home).  3# weights at home 5x/week.  She sees dermatologist yearly    PMH, PSH, SH reviewed     Review of Systems  Constitutional:  Negative for chills and fever. Weight loss: weight gain. HENT:  Positive for hearing loss (slightly worse than last year). Negative for congestion (allergies are controlled with flonase and claritin).   Eyes: Negative.   Respiratory:  Negative for cough, shortness of breath and wheezing.   Cardiovascular:  Negative for chest pain, palpitations and leg swelling.  Gastrointestinal:  Positive for abdominal pain (she reports frequent problems with stomach aches; bowels are well controlled with daily probiotic), blood in stool (notes BRB once a week, states from hemorroids. Denies straining/constipation/diarrhea) and heartburn. Negative for constipation, diarrhea, melena, nausea and vomiting.  Genitourinary:  Negative for dysuria, frequency, hematuria and urgency.       Notes some stress incontinence (with exercise)  Musculoskeletal:  Positive for back pain and neck pain. Negative for joint pain.  Skin:  Negative for rash.  Neurological:  Negative for dizziness, tingling, tremors, weakness and headaches.   Endo/Heme/Allergies:  Positive for environmental allergies (controlled with meds). Does not bruise/bleed easily.  Psychiatric/Behavioral:  Negative for depression (controlled with meds). The patient has insomnia (requires daily Unisom use). The patient is not nervous/anxious.    Occ pain suprapubically (?IC in past), lots of stomach aches.  ***UPDATE ALL   PHYSICAL EXAM:  LMP 09/01/2012   Wt Readings from Last 3 Encounters:  10/01/23 170 lb (77.1 kg)  08/25/23 168 lb (76.2 kg)  03/19/23 172 lb 12.8 oz (78.4 kg)    General Appearance:    Alert, cooperative, no distress, appears stated age  Head:    Normocephalic, without obvious abnormality, atraumatic  Eyes:    PERRL, conjunctiva/corneas clear, EOM's intact, fundi    benign  Ears:    Normal TM's and external ear canals  Nose:   Nares normal, no drainage or sinus tenderness  Throat:   Lips, mucosa, and tongue normal; teeth and gums normal  Neck:   Supple, no lymphadenopathy;  thyroid :  no enlargement/ tenderness/nodules; no carotid bruit or JVD  Back:    Spine nontender, no curvature, ROM normal, no CVA     tenderness  Lungs:     Clear to auscultation bilaterally without wheezes, rales or     ronchi; respirations unlabored  Chest Wall:    No tenderness or deformity   Heart:    Regular rate and rhythm, S1 and S2 normal, no murmur, rub   or gallop  Breast Exam:    No tenderness, masses, or nipple discharge or inversion.      No axillary lymphadenopathy  Abdomen:     Soft, non-tender, nondistended, normoactive bowel sounds,    no masses, no hepatosplenomegaly  Genitalia:    Normal external genitalia without lesions.  BUS and vagina normal. Some thin, white vaginal discharge noted, no cervical motion tenderness. Uterus and adnexa not enlarged, nontender, no masses.  Pap not performed  Rectal:    Normal tone, no masses or tenderness; guaiac negative stool. No true hemorrhoids noted. Had some tags, one of which the distal portion  (anteriorly) was slightly inflamed.    Extremities:   No clubbing, cyanosis or edema  Pulses:   2+ and symmetric all extremities  Skin:   Skin color, texture, turgor normal, no  rashes or lesions. +tattoos  Lymph nodes:   Cervical, supraclavicular, inguinal and axillary nodes normal  Neurologic:   CNII-XII intact, normal strength, sensation and gait; reflexes 2+ and symmetric throughout          Psych:   Normal mood, affect, hygiene and grooming.    ***UPDATE RECTAL     10/01/2023    3:43 PM 08/25/2023    3:28 PM 03/19/2023    9:54 AM 02/07/2021    3:55 PM  GAD 7 : Generalized Anxiety Score  Nervous, Anxious, on Edge 0 0 2 1  Control/stop worrying 0 0 0 0  Worry too much - different things 0 0 0 0  Trouble relaxing 0 1 0 1  Restless 0 0 0 0  Easily annoyed or irritable 0 1 0 1  Afraid - awful might happen 0 0 0 0  Total GAD 7 Score 0 2 2 3   Anxiety Difficulty Not difficult at all Not difficult at all Not difficult at all Not difficult at all       10/01/2023    3:42 PM 08/25/2023    3:26 PM 03/19/2023    9:52 AM 02/20/2022    9:35 AM 02/07/2021    3:55 PM  Depression screen PHQ 2/9  Decreased Interest 0 0 0 0 0  Down, Depressed, Hopeless 0 0 0 0 0  PHQ - 2 Score 0 0 0 0 0  Altered sleeping 1 1 1 1 1   Tired, decreased energy 0 0 0 1 1  Change in appetite 0 0 0 0 0  Feeling bad or failure about yourself  0 0 0 0 0  Trouble concentrating 0 0 0 0 0  Moving slowly or fidgety/restless 0 0 0 0 0  Suicidal thoughts 0 0 0 0 0  PHQ-9 Score 1 1 1 2 2   Difficult doing work/chores Not difficult at all Not difficult at all Not difficult at all Not difficult at all Not difficult at all     ASSESSMENT/PLAN:  Did she have tummy tuck with Dr. Lorretta 02/12/24? (She had sent message). Please enter under PSH  Why was norethindrone  stopped??  That is the progesterone that she needs to take along with her estrace . Cannot take 1 without the other unless she has had a hysterectomy, or if she is  now on a different progesterone (ie if seeing GYN).  Last year I started rx'ing her HRT instead of GYN. Is she seeing a GYN now? Is she on a different progesterone?  ARe they rx'ing estrace ?  Flu, COVID. Also will need prevnar-20  Add probiotic to med list, if still taking. Shouldn't be taking D3, just MVI.  PHQ-9 and GAD-7 both needed.   Discussed monthly self breast exams and yearly mammograms; at least 30 minutes of aerobic activity at least 5 days/week, weight-bearing exercise at least 2x/week; proper sunscreen use reviewed; healthy diet, including goals of calcium and vitamin D  intake and alcohol recommendations (less than or equal to 1 drink/day) reviewed; regular seatbelt use; changing batteries in smoke detectors.  Immunization recommendations discussed--continue yearly flu shots. *** COVID booster *** Prevnar-20 recommended/discussed (vs Capvaxive-21).    Colonoscopy recommendations reviewed, due again with Dr. Kristie 05/2027.  Last year we discussed seeing her sooner due to her many GI complaints (reflux, various abdominal pains/stomach aches, and weekly rectal bleeding).  Pap due 09/2025  F/u 1 year, sooner prn

## 2024-03-30 NOTE — Patient Instructions (Incomplete)
  HEALTH MAINTENANCE RECOMMENDATIONS:  It is recommended that you get at least 30 minutes of aerobic exercise at least 5 days/week (for weight loss, you may need as much as 60-90 minutes). This can be any activity that gets your heart rate up. This can be divided in 10-15 minute intervals if needed, but try and build up your endurance at least once a week.  Weight bearing exercise is also recommended twice weekly.  Eat a healthy diet with lots of vegetables, fruits and fiber.  Colorful foods have a lot of vitamins (ie green vegetables, tomatoes, red peppers, etc).  Limit sweet tea, regular sodas and alcoholic beverages, all of which has a lot of calories and sugar.  Up to 1 alcoholic drink daily may be beneficial for women (unless trying to lose weight, watch sugars).  Drink a lot of water.  Calcium recommendations are 1200-1500 mg daily (1500 mg for postmenopausal women or women without ovaries), and vitamin D  1000 IU daily.  This should be obtained from diet and/or supplements (vitamins), and calcium should not be taken all at once, but in divided doses.  Monthly self breast exams and yearly mammograms for women over the age of 17 is recommended.  Sunscreen of at least SPF 30 should be used on all sun-exposed parts of the skin when outside between the hours of 10 am and 4 pm (not just when at beach or pool, but even with exercise, golf, tennis, and yard work!)  Use a sunscreen that says broad spectrum so it covers both UVA and UVB rays, and make sure to reapply every 1-2 hours.  Remember to change the batteries in your smoke detectors when changing your clock times in the spring and fall. Carbon monoxide detectors are recommended for your home.  Use your seat belt every time you are in a car, and please drive safely and not be distracted with cell phones and texting while driving.  We discussed recommendation for either Prevnar-20 or Capvaxive-21 (types of pneumonia vaccines that are now  recommended--one or the other, just once). You can schedule a nurse visit to get the Prevnar-20 in 2 weeks, or wait until your next appointment. If you prefer to get Capvaxive-21, you need to get that from a pharmacy that carries it.

## 2024-03-31 ENCOUNTER — Ambulatory Visit: Payer: 59 | Admitting: Family Medicine

## 2024-03-31 ENCOUNTER — Encounter: Payer: Self-pay | Admitting: Family Medicine

## 2024-03-31 ENCOUNTER — Encounter: Payer: Self-pay | Admitting: *Deleted

## 2024-03-31 VITALS — BP 130/82 | HR 84 | Ht 68.0 in | Wt 173.8 lb

## 2024-03-31 DIAGNOSIS — E7800 Pure hypercholesterolemia, unspecified: Secondary | ICD-10-CM

## 2024-03-31 DIAGNOSIS — K219 Gastro-esophageal reflux disease without esophagitis: Secondary | ICD-10-CM | POA: Diagnosis not present

## 2024-03-31 DIAGNOSIS — E559 Vitamin D deficiency, unspecified: Secondary | ICD-10-CM | POA: Diagnosis not present

## 2024-03-31 DIAGNOSIS — Z0000 Encounter for general adult medical examination without abnormal findings: Secondary | ICD-10-CM

## 2024-03-31 DIAGNOSIS — F419 Anxiety disorder, unspecified: Secondary | ICD-10-CM

## 2024-03-31 DIAGNOSIS — F988 Other specified behavioral and emotional disorders with onset usually occurring in childhood and adolescence: Secondary | ICD-10-CM

## 2024-03-31 DIAGNOSIS — Z79890 Hormone replacement therapy (postmenopausal): Secondary | ICD-10-CM | POA: Diagnosis not present

## 2024-03-31 DIAGNOSIS — Z23 Encounter for immunization: Secondary | ICD-10-CM

## 2024-03-31 DIAGNOSIS — F325 Major depressive disorder, single episode, in full remission: Secondary | ICD-10-CM | POA: Diagnosis not present

## 2024-03-31 DIAGNOSIS — G4700 Insomnia, unspecified: Secondary | ICD-10-CM

## 2024-03-31 LAB — LIPID PANEL

## 2024-03-31 MED ORDER — SERTRALINE HCL 100 MG PO TABS: 100.0000 mg | ORAL_TABLET | Freq: Every day | ORAL | 3 refills | Status: AC

## 2024-04-01 ENCOUNTER — Ambulatory Visit: Payer: Self-pay | Admitting: Family Medicine

## 2024-04-01 LAB — CBC WITH DIFFERENTIAL/PLATELET
Basophils Absolute: 0 x10E3/uL (ref 0.0–0.2)
Basos: 1 %
EOS (ABSOLUTE): 0.1 x10E3/uL (ref 0.0–0.4)
Eos: 1 %
Hematocrit: 46.2 % (ref 34.0–46.6)
Hemoglobin: 15.2 g/dL (ref 11.1–15.9)
Immature Grans (Abs): 0 x10E3/uL (ref 0.0–0.1)
Immature Granulocytes: 0 %
Lymphocytes Absolute: 1.7 x10E3/uL (ref 0.7–3.1)
Lymphs: 29 %
MCH: 32.5 pg (ref 26.6–33.0)
MCHC: 32.9 g/dL (ref 31.5–35.7)
MCV: 99 fL — ABNORMAL HIGH (ref 79–97)
Monocytes Absolute: 0.5 x10E3/uL (ref 0.1–0.9)
Monocytes: 8 %
Neutrophils Absolute: 3.7 x10E3/uL (ref 1.4–7.0)
Neutrophils: 61 %
Platelets: 309 x10E3/uL (ref 150–450)
RBC: 4.67 x10E6/uL (ref 3.77–5.28)
RDW: 11.1 % — ABNORMAL LOW (ref 11.7–15.4)
WBC: 6.1 x10E3/uL (ref 3.4–10.8)

## 2024-04-01 LAB — COMPREHENSIVE METABOLIC PANEL WITH GFR
ALT: 15 IU/L (ref 0–32)
AST: 18 IU/L (ref 0–40)
Albumin: 4.6 g/dL (ref 3.9–4.9)
Alkaline Phosphatase: 77 IU/L (ref 49–135)
BUN/Creatinine Ratio: 10 — AB (ref 12–28)
BUN: 9 mg/dL (ref 8–27)
Bilirubin Total: 0.5 mg/dL (ref 0.0–1.2)
CO2: 19 mmol/L — AB (ref 20–29)
Calcium: 9.5 mg/dL (ref 8.7–10.3)
Chloride: 103 mmol/L (ref 96–106)
Creatinine, Ser: 0.86 mg/dL (ref 0.57–1.00)
Globulin, Total: 2.5 g/dL (ref 1.5–4.5)
Glucose: 99 mg/dL (ref 70–99)
Potassium: 4.2 mmol/L (ref 3.5–5.2)
Sodium: 141 mmol/L (ref 134–144)
Total Protein: 7.1 g/dL (ref 6.0–8.5)
eGFR: 76 mL/min/1.73 (ref >59)

## 2024-04-01 LAB — LIPID PANEL
Cholesterol, Total: 205 mg/dL — AB (ref 100–199)
HDL: 56 mg/dL (ref >39)
LDL CALC COMMENT:: 3.7 ratio (ref 0.0–4.4)
LDL Chol Calc (NIH): 125 mg/dL — AB (ref 0–99)
Triglycerides: 135 mg/dL (ref 0–149)
VLDL Cholesterol Cal: 24 mg/dL (ref 5–40)

## 2024-04-01 LAB — VITAMIN D 25 HYDROXY (VIT D DEFICIENCY, FRACTURES): Vit D, 25-Hydroxy: 54.6 ng/mL (ref 30.0–100.0)

## 2024-04-01 LAB — TSH: TSH: 0.97 u[IU]/mL (ref 0.450–4.500)

## 2024-04-05 ENCOUNTER — Encounter: Payer: Self-pay | Admitting: Family Medicine

## 2024-04-06 MED ORDER — SCOPOLAMINE 1 MG/3DAYS TD PT72: 1.0000 | MEDICATED_PATCH | TRANSDERMAL | 0 refills | Status: AC

## 2024-05-19 ENCOUNTER — Encounter: Payer: Self-pay | Admitting: Family Medicine

## 2024-05-30 ENCOUNTER — Other Ambulatory Visit: Payer: Self-pay | Admitting: Family Medicine

## 2024-05-30 ENCOUNTER — Encounter: Payer: Self-pay | Admitting: Family Medicine

## 2024-05-30 DIAGNOSIS — Z79890 Hormone replacement therapy (postmenopausal): Secondary | ICD-10-CM

## 2024-05-31 ENCOUNTER — Other Ambulatory Visit: Payer: Self-pay

## 2024-05-31 DIAGNOSIS — Z79890 Hormone replacement therapy (postmenopausal): Secondary | ICD-10-CM

## 2024-06-08 ENCOUNTER — Other Ambulatory Visit: Payer: Self-pay | Admitting: Family Medicine

## 2024-06-08 DIAGNOSIS — Z1231 Encounter for screening mammogram for malignant neoplasm of breast: Secondary | ICD-10-CM

## 2024-06-30 ENCOUNTER — Ambulatory Visit
Admission: RE | Admit: 2024-06-30 | Discharge: 2024-06-30 | Disposition: A | Payer: Self-pay | Source: Ambulatory Visit | Attending: Family Medicine | Admitting: Family Medicine

## 2024-06-30 DIAGNOSIS — Z1231 Encounter for screening mammogram for malignant neoplasm of breast: Secondary | ICD-10-CM

## 2024-07-04 ENCOUNTER — Other Ambulatory Visit: Payer: Self-pay | Admitting: Medical

## 2024-07-04 NOTE — Telephone Encounter (Signed)
 Is this okay to refill?

## 2025-04-20 ENCOUNTER — Encounter: Payer: Self-pay | Admitting: Family Medicine
